# Patient Record
Sex: Female | Born: 1985 | Race: White | Hispanic: No | Smoking: Current every day smoker
Health system: Southern US, Community
[De-identification: ages and names within clinical notes are randomized; demographics above are authoritative.]

## PROBLEM LIST (undated history)

## (undated) DIAGNOSIS — IMO0002 Reserved for concepts with insufficient information to code with codable children: Secondary | ICD-10-CM

## (undated) DIAGNOSIS — R87619 Unspecified abnormal cytological findings in specimens from cervix uteri: Secondary | ICD-10-CM

## (undated) DIAGNOSIS — E669 Obesity, unspecified: Secondary | ICD-10-CM

## (undated) DIAGNOSIS — F32A Depression, unspecified: Secondary | ICD-10-CM

## (undated) DIAGNOSIS — R87629 Unspecified abnormal cytological findings in specimens from vagina: Secondary | ICD-10-CM

## (undated) DIAGNOSIS — N926 Irregular menstruation, unspecified: Secondary | ICD-10-CM

## (undated) DIAGNOSIS — F419 Anxiety disorder, unspecified: Secondary | ICD-10-CM

## (undated) HISTORY — DX: Obesity, unspecified: E66.9

## (undated) HISTORY — DX: Unspecified abnormal cytological findings in specimens from vagina: R87.629

## (undated) HISTORY — DX: Reserved for concepts with insufficient information to code with codable children: IMO0002

## (undated) HISTORY — DX: Unspecified abnormal cytological findings in specimens from cervix uteri: R87.619

## (undated) HISTORY — DX: Irregular menstruation, unspecified: N92.6

---
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
## 2007-04-21 ENCOUNTER — Other Ambulatory Visit: Admission: RE | Admit: 2007-04-21 | Discharge: 2007-04-21 | Payer: Self-pay | Admitting: Obstetrics and Gynecology

## 2007-05-10 ENCOUNTER — Emergency Department (HOSPITAL_COMMUNITY): Admission: EM | Admit: 2007-05-10 | Discharge: 2007-05-10 | Payer: Self-pay | Admitting: Emergency Medicine

## 2007-10-21 ENCOUNTER — Emergency Department (HOSPITAL_COMMUNITY): Admission: EM | Admit: 2007-10-21 | Discharge: 2007-10-21 | Payer: Self-pay | Admitting: Emergency Medicine

## 2009-12-23 ENCOUNTER — Ambulatory Visit (HOSPITAL_COMMUNITY): Admission: RE | Admit: 2009-12-23 | Discharge: 2009-12-23 | Payer: Self-pay | Admitting: Family Medicine

## 2010-02-08 ENCOUNTER — Ambulatory Visit (HOSPITAL_COMMUNITY)
Admission: RE | Admit: 2010-02-08 | Discharge: 2010-02-08 | Payer: Self-pay | Admitting: Physical Medicine and Rehabilitation

## 2011-02-28 LAB — URINE CULTURE: Colony Count: 2000

## 2011-02-28 LAB — URINALYSIS, ROUTINE W REFLEX MICROSCOPIC
Bilirubin Urine: NEGATIVE
Glucose, UA: NEGATIVE
Hgb urine dipstick: NEGATIVE
Ketones, ur: NEGATIVE
Nitrite: NEGATIVE
Protein, ur: NEGATIVE
Specific Gravity, Urine: 1.025
Urobilinogen, UA: 0.2
pH: 7

## 2011-02-28 LAB — URINE MICROSCOPIC-ADD ON

## 2011-03-12 LAB — BASIC METABOLIC PANEL
BUN: 14
Calcium: 9.2
GFR calc non Af Amer: >60
Glucose, Bld: 119 — ABNORMAL HIGH

## 2011-09-17 IMAGING — CR Imaging study
6 series · 6 of 6 positions shown · non-contrast
Comparison: None

CLINICAL DATA: Worsening neck and shoulder pain for 1 year.

CERVICAL SPINE - COMPLETE 4+ VIEW

[view not recorded (1 of 6)]
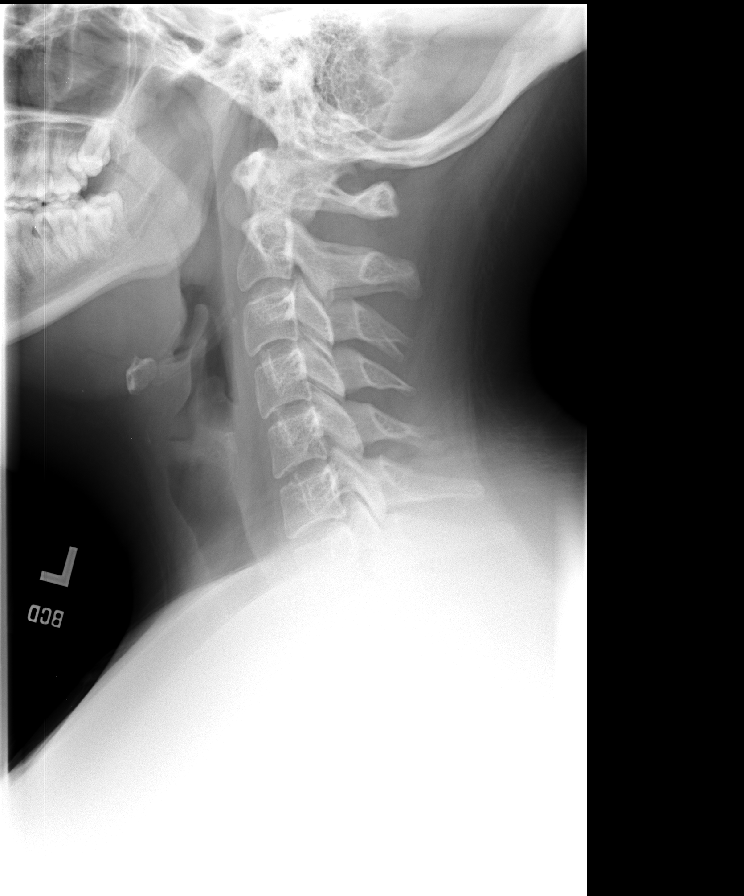

[view not recorded (2 of 6)]
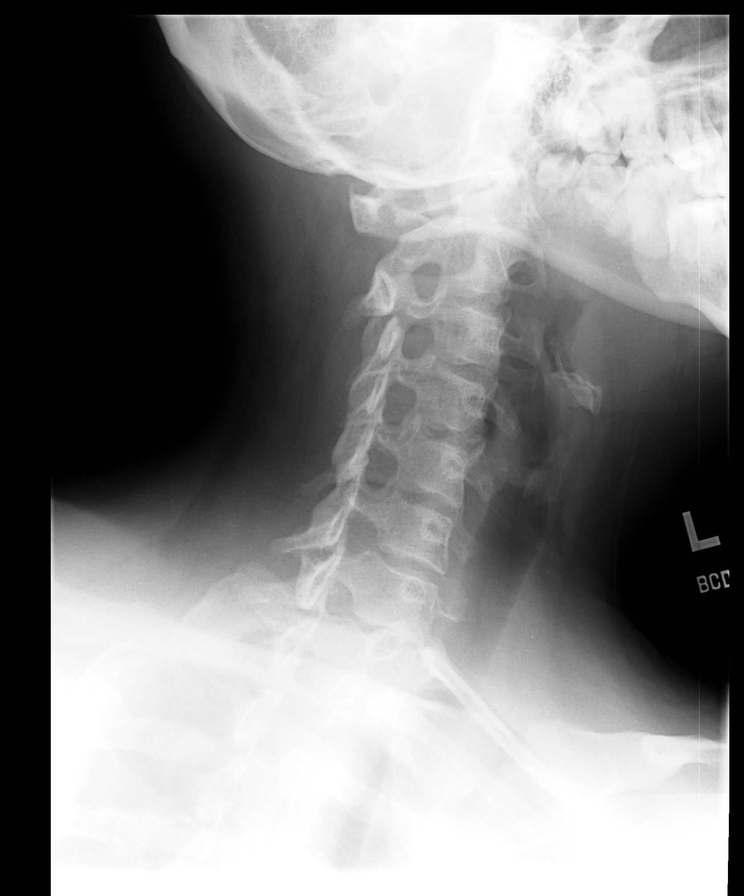

[view not recorded (3 of 6)]
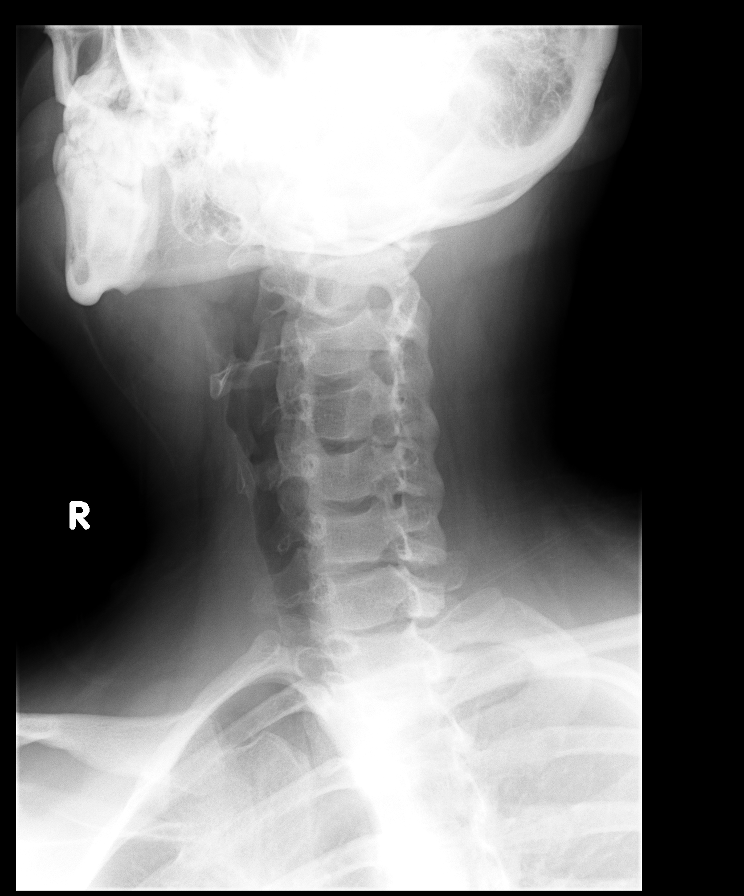

[view not recorded (4 of 6)]
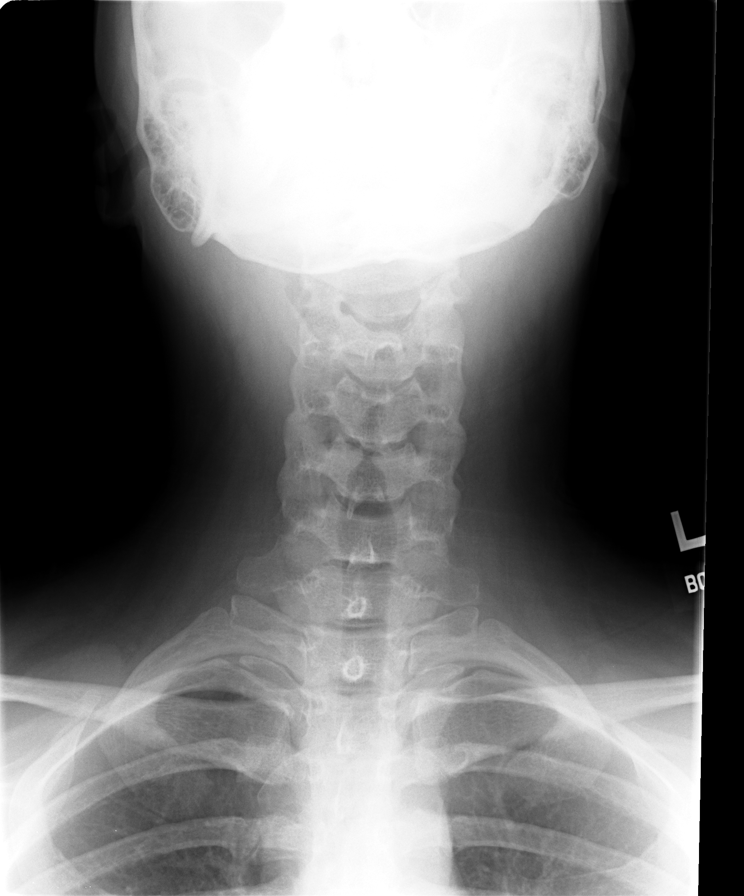

[view not recorded (5 of 6)]
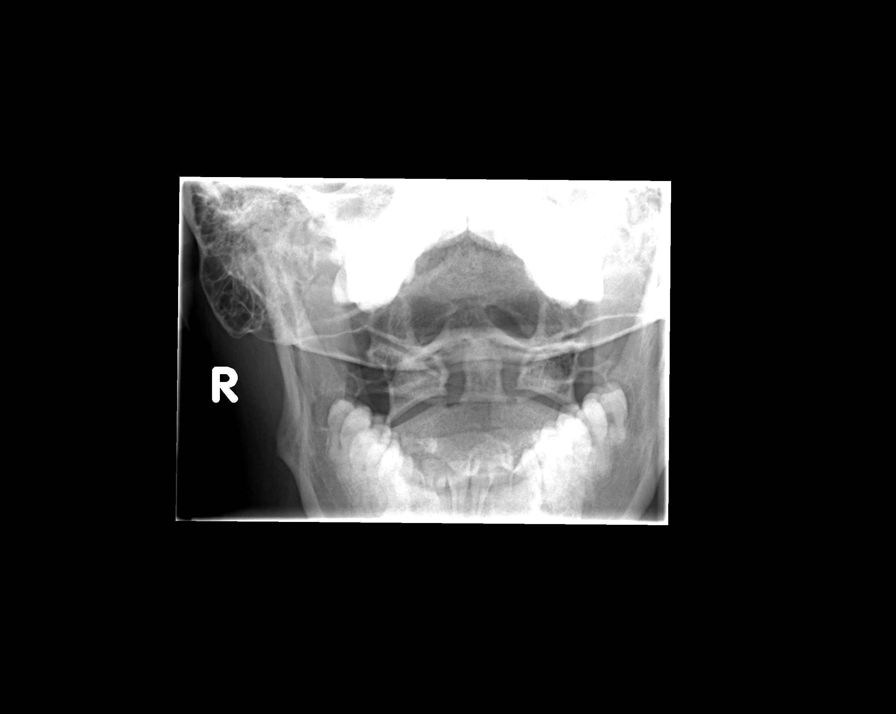

[view not recorded (6 of 6)]
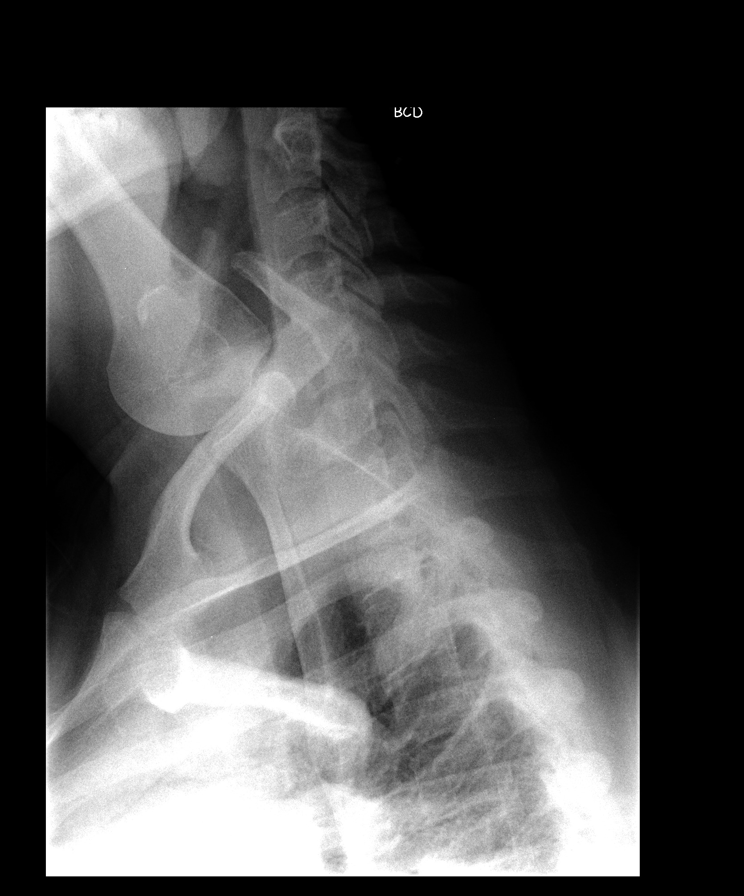

[6 of 6 positions shown; findings below may reference images not displayed]

FINDINGS: There is normal alignment of the cervical spine.  There
is no evidence for acute fracture or dislocation.  Prevertebral
soft tissues have a normal appearance.  Lung apices have a normal
appearance.
IMPRESSION: Negative exam.

## 2012-04-09 ENCOUNTER — Other Ambulatory Visit (HOSPITAL_COMMUNITY)
Admission: RE | Admit: 2012-04-09 | Discharge: 2012-04-09 | Disposition: A | Discharge status: Home / Self Care | Payer: Self-pay | Source: Ambulatory Visit | Attending: Obstetrics and Gynecology | Admitting: Obstetrics and Gynecology

## 2012-04-09 DIAGNOSIS — Z01419 Encounter for gynecological examination (general) (routine) without abnormal findings: Secondary | ICD-10-CM | POA: Insufficient documentation

## 2012-09-18 ENCOUNTER — Telehealth: Payer: Self-pay | Admitting: *Deleted

## 2012-09-18 ENCOUNTER — Telehealth: Payer: Self-pay | Admitting: Adult Health

## 2012-09-18 DIAGNOSIS — Z309 Encounter for contraceptive management, unspecified: Secondary | ICD-10-CM

## 2012-09-18 MED ORDER — NORETHIN ACE-ETH ESTRAD-FE 1-20 MG-MCG PO TABS: 1.0000 | ORAL_TABLET | Freq: Every day | ORAL | Status: DC

## 2012-09-18 MED ORDER — NORETHIN-ETH ESTRAD-FE BIPHAS 1 MG-10 MCG / 10 MCG PO TABS: 1.0000 | ORAL_TABLET | Freq: Every day | ORAL | Status: DC

## 2012-09-18 NOTE — Telephone Encounter (Signed)
Appt made for pt to followup for pap in June. Verbal order for Lo loestrin Fe  Disp. 1 pack take 1 daily with 6 refills per Cyril Mourning, NP. Pt aware.

## 2012-09-18 NOTE — Telephone Encounter (Signed)
Lo loestrin $90 wants cheap, will change to junel 1/20

## 2012-11-05 ENCOUNTER — Encounter: Payer: Self-pay | Admitting: *Deleted

## 2012-11-06 ENCOUNTER — Encounter: Payer: Self-pay | Admitting: *Deleted

## 2012-11-06 ENCOUNTER — Other Ambulatory Visit: Payer: Self-pay | Admitting: Obstetrics & Gynecology

## 2012-11-13 ENCOUNTER — Ambulatory Visit (INDEPENDENT_AMBULATORY_CARE_PROVIDER_SITE_OTHER): Payer: BC Managed Care – PPO | Admitting: Family Medicine

## 2012-11-13 ENCOUNTER — Encounter: Payer: Self-pay | Admitting: Family Medicine

## 2012-11-13 VITALS — BP 110/62 | Temp 98.2°F | Wt 212.0 lb

## 2012-11-13 DIAGNOSIS — R21 Rash and other nonspecific skin eruption: Secondary | ICD-10-CM

## 2012-11-13 MED ORDER — MINOCYCLINE HCL 100 MG PO CAPS: 100.0000 mg | ORAL_CAPSULE | Freq: Two times a day (BID) | ORAL | Status: AC

## 2012-11-13 MED ORDER — CLINDAMYCIN PHOSPHATE 1 % EX SOLN: CUTANEOUS | Status: DC

## 2012-11-13 NOTE — Progress Notes (Signed)
  Subjective:    Patient ID: Jeanne Delgado, female    DOB: 18-Jan-1986, 27 y.o.   MRN: [id number]  HPI  Last wk cut shrubs outside, then rash developed after that eve, didn't know whether due to cosmetics or brush. Rash swollen. Eyes irritated.   Review of Systems Fever no vomiting no headache no cough ROS otherwise negative    Objective:   Physical Exam  Alert no acute distress. Vitals stable. HEENT normal. Lungs clear. Heart regular in rhythm. Skin diffuse acne-like eruption on face upper trunk shoulders      Assessment & Plan:  Impression folliculitis/acne a form rash. Plan Cleocin T. twice a day topically. Minocycline twice a day oral. Symptomatic care discussed.

## 2013-02-16 ENCOUNTER — Other Ambulatory Visit: Payer: Self-pay | Admitting: Family Medicine

## 2014-01-27 ENCOUNTER — Encounter: Payer: Self-pay | Admitting: Family Medicine

## 2014-01-27 ENCOUNTER — Ambulatory Visit (INDEPENDENT_AMBULATORY_CARE_PROVIDER_SITE_OTHER): Payer: Self-pay | Admitting: Family Medicine

## 2014-01-27 VITALS — BP 108/70 | Ht 68.0 in | Wt 238.0 lb

## 2014-01-27 DIAGNOSIS — R197 Diarrhea, unspecified: Secondary | ICD-10-CM

## 2014-01-27 DIAGNOSIS — M545 Low back pain, unspecified: Secondary | ICD-10-CM

## 2014-01-27 DIAGNOSIS — R109 Unspecified abdominal pain: Secondary | ICD-10-CM

## 2014-01-27 LAB — POCT URINALYSIS DIPSTICK
Blood, UA: 250
SPEC GRAV UA: 1.025
Urobilinogen, UA: 2
pH, UA: 7

## 2014-01-27 MED ORDER — ONDANSETRON HCL 8 MG PO TABS: 8.0000 mg | ORAL_TABLET | Freq: Three times a day (TID) | ORAL | Status: DC | PRN

## 2014-01-27 MED ORDER — CIPROFLOXACIN HCL 500 MG PO TABS: 500.0000 mg | ORAL_TABLET | Freq: Two times a day (BID) | ORAL | Status: DC

## 2014-01-27 MED ORDER — TRAMADOL HCL 50 MG PO TABS: 50.0000 mg | ORAL_TABLET | Freq: Four times a day (QID) | ORAL | Status: DC | PRN

## 2014-01-27 NOTE — Progress Notes (Signed)
   Subjective:    Patient ID: Jeanne Delgado, female    DOB: 03/18/1986, 28 y.o.   MRN: [id number]  HPI Patient is here today due to nausea, vomiting & lower back pain & diarrhea  x 2 days.  This patient has had episodes of severe abdominal pain mainly in abdomen lower abdomen along with some loose stools at times needs. She denies blood in her stool denies fever chills night sweats. Does not seem to occur after eating. He just seemed to get her out of the blue this last time it woke her up at 4 AM. The pain was severe. He did not localize. She has had previous trouble at this months ago. He states he uses a fair amount of ibuprofen she denies excessive use. She denies stomach pains or reflux.  Review of Systems  Constitutional: Negative for fever.  HENT: Negative for congestion and ear discharge.   Respiratory: Negative for cough and shortness of breath.   Cardiovascular: Negative for chest pain.  Gastrointestinal: Negative for abdominal pain.       Objective:   Physical Exam  Constitutional: She is oriented to person, place, and time. She appears well-developed. No distress.  HENT:  Head: Normocephalic.  Neck: Neck supple. No thyromegaly present.  Cardiovascular: Normal rate and regular rhythm.   Pulmonary/Chest: Effort normal and breath sounds normal. No respiratory distress. She has no wheezes.  Abdominal: Soft. There is tenderness. There is no rebound and no guarding.  Mid to mid lower  Neurological: She is alert and oriented to person, place, and time.          Assessment & Plan:  Mid to lower abdominal pain and discomfort we will do lab work also do stool tests patient may need referral to gastroenterology for colonoscopy. There is a possibility that there could be inflammatory bowel disease or colitis intermittently occuring

## 2014-01-29 LAB — URINE CULTURE

## 2014-04-08 ENCOUNTER — Other Ambulatory Visit: Payer: Self-pay | Admitting: Obstetrics & Gynecology

## 2014-05-05 ENCOUNTER — Encounter: Payer: Self-pay | Admitting: Nurse Practitioner

## 2014-05-05 ENCOUNTER — Ambulatory Visit (INDEPENDENT_AMBULATORY_CARE_PROVIDER_SITE_OTHER): Payer: BC Managed Care – PPO | Admitting: Nurse Practitioner

## 2014-05-05 VITALS — BP 120/78 | Temp 98.4°F | Ht 68.0 in | Wt 239.4 lb

## 2014-05-05 DIAGNOSIS — J310 Chronic rhinitis: Secondary | ICD-10-CM

## 2014-05-05 DIAGNOSIS — K2970 Gastritis, unspecified, without bleeding: Secondary | ICD-10-CM

## 2014-05-05 DIAGNOSIS — J329 Chronic sinusitis, unspecified: Secondary | ICD-10-CM

## 2014-05-05 DIAGNOSIS — K2990 Gastroduodenitis, unspecified, without bleeding: Secondary | ICD-10-CM

## 2014-05-05 DIAGNOSIS — J209 Acute bronchitis, unspecified: Secondary | ICD-10-CM

## 2014-05-05 DIAGNOSIS — K589 Irritable bowel syndrome without diarrhea: Secondary | ICD-10-CM

## 2014-05-05 MED ORDER — OMEPRAZOLE 20 MG PO CPDR: 20.0000 mg | DELAYED_RELEASE_CAPSULE | Freq: Two times a day (BID) | ORAL | Status: DC

## 2014-05-05 MED ORDER — HYDROCODONE-HOMATROPINE 5-1.5 MG/5ML PO SYRP: 5.0000 mL | ORAL_SOLUTION | ORAL | Status: DC | PRN

## 2014-05-05 MED ORDER — AZITHROMYCIN 250 MG PO TABS: ORAL_TABLET | ORAL | Status: DC

## 2014-05-05 MED ORDER — ALBUTEROL SULFATE HFA 108 (90 BASE) MCG/ACT IN AERS: 2.0000 | INHALATION_SPRAY | RESPIRATORY_TRACT | Status: DC | PRN

## 2014-05-05 NOTE — Patient Instructions (Signed)
activia yogurt 2 cups per day OR Insurance claims handlerAlign   Food Choices for Gastroesophageal Reflux Disease When you have gastroesophageal reflux disease (GERD), the foods you eat and your eating habits are very important. Choosing the right foods can help ease the discomfort of GERD. WHAT GENERAL GUIDELINES DO I NEED TO FOLLOW?  Choose fruits, vegetables, whole grains, low-fat dairy products, and low-fat meat, fish, and poultry.  Limit fats such as oils, salad dressings, butter, nuts, and avocado.  Keep a food diary to identify foods that cause symptoms.  Avoid foods that cause reflux. These may be different for different people.  Eat frequent small meals instead of three large meals each day.  Eat your meals slowly, in a relaxed setting.  Limit fried foods.  Cook foods using methods other than frying.  Avoid drinking alcohol.  Avoid drinking large amounts of liquids with your meals.  Avoid bending over or lying down until 2-3 hours after eating. WHAT FOODS ARE NOT RECOMMENDED? The following are some foods and drinks that may worsen your symptoms: Vegetables Tomatoes. Tomato juice. Tomato and spaghetti sauce. Chili peppers. Onion and garlic. Horseradish. Fruits Oranges, grapefruit, and lemon (fruit and juice). Meats High-fat meats, fish, and poultry. This includes hot dogs, ribs, ham, sausage, salami, and bacon. Dairy Whole milk and chocolate milk. Sour cream. Cream. Butter. Ice cream. Cream cheese.  Beverages Coffee and tea, with or without caffeine. Carbonated beverages or energy drinks. Condiments Hot sauce. Barbecue sauce.  Sweets/Desserts Chocolate and cocoa. Donuts. Peppermint and spearmint. Fats and Oils High-fat foods, including [place] fries and potato chips. Other Vinegar. Strong spices, such as black pepper, white pepper, red pepper, cayenne, curry powder, cloves, ginger, and chili powder. The items listed above may not be a complete list of foods and beverages to avoid.  Contact your dietitian for more information. Document Released: 05/21/2005 Document Revised: 05/26/2013 Document Reviewed: 03/25/2013 [facility]ExitCare Patient Information 2015 [place], [place]. This information is not intended to replace advice given to you by your health care provider. Make sure you discuss any questions you have with your health care provider. Irritable Bowel Syndrome Irritable bowel syndrome (IBS) is caused by a disturbance of normal bowel function and is a common digestive disorder. You may also hear this condition called spastic colon, mucous colitis, and irritable colon. There is no cure for IBS. However, symptoms often gradually improve or disappear with a good diet, stress management, and medicine. This condition usually appears in late adolescence or early adulthood. Women develop it twice as often as men. CAUSES  After food has been digested and absorbed in the small intestine, waste material is moved into the large intestine, or colon. In the colon, water and salts are absorbed from the undigested products coming from the small intestine. The remaining residue, or fecal material, is held for elimination. Under normal circumstances, gentle, rhythmic contractions of the bowel walls push the fecal material along the colon toward the rectum. In IBS, however, these contractions are irregular and poorly coordinated. The fecal material is either retained too long, resulting in constipation, or expelled too soon, producing diarrhea. SIGNS AND SYMPTOMS  The most common symptom of IBS is abdominal pain. It is often in the lower left side of the abdomen, but it may occur anywhere in the abdomen. The pain comes from spasms of the bowel muscles happening too much and from the buildup of gas and fecal material in the colon. This pain:  Can range from sharp abdominal cramps to a dull, continuous ache.  Often worsens soon after eating.  Is often relieved by having a bowel movement or passing  gas. Abdominal pain is usually accompanied by constipation, but it may also produce diarrhea. The diarrhea often occurs right after a meal or upon waking up in the morning. The stools are often soft, watery, and flecked with mucus. Other symptoms of IBS include:  Bloating.  Loss of appetite.  Heartburn.  Backache.  Dull pain in the arms or shoulders.  Nausea.  Burping.  Vomiting.  Gas. IBS may also cause symptoms that are unrelated to the digestive system, such as:  Fatigue.  Headaches.  Anxiety.  Shortness of breath.  Trouble concentrating.  Dizziness. These symptoms tend to come and go. DIAGNOSIS  The symptoms of IBS may seem like symptoms of other, more serious digestive disorders. Your health care provider may want to perform tests to exclude these disorders.  TREATMENT Many medicines are available to help correct bowel function or relieve bowel spasms and abdominal pain. Among the medicines available are:  Laxatives for severe constipation and to help restore normal bowel habits.  Specific antidiarrheal medicines to treat severe or lasting diarrhea.  Antispasmodic agents to relieve intestinal cramps. Your health care provider may also decide to treat you with a mild tranquilizer or sedative during unusually stressful periods in your life. Your health care provider may also prescribe antidepressant medicine. The use of this medicine has been shown to reduce pain and other symptoms of IBS. Remember that if any medicine is prescribed for you, you should take it exactly as directed. Make sure your health care provider knows how well it worked for you. HOME CARE INSTRUCTIONS   Take all medicines as directed by your health care provider.  Avoid foods that are high in fat or oils, such as heavy cream, butter, frankfurters, sausage, and other fatty meats.  Avoid foods that make you go to the bathroom, such as fruit, fruit juice, and dairy products.  Cut out  carbonated drinks, chewing gum, and "gassy" foods such as beans and cabbage. This may help relieve bloating and burping.  Eat foods with bran, and drink plenty of liquids with the bran foods. This helps relieve constipation.  Keep track of what foods seem to bring on your symptoms.  Avoid emotionally charged situations or circumstances that produce anxiety.  Start or continue exercising.  Get plenty of rest and sleep. Document Released: 05/21/2005 Document Revised: 05/26/2013 Document Reviewed: 01/09/2008 [facility] Patient Information 2015 [place]Care, [place]. This information is not intended to replace advice given to you by your health care provider. Make sure you discuss any questions you have with your health care provider.

## 2014-05-05 NOTE — Progress Notes (Signed)
Subjective:  Presents for c/o cough x 4 d. Worsening. Usually nonproductive but green sputum noted this am. Wheezing began 2 d ago. Watery eyes. Ear pain. Sore throat. No headache. Having acid reflux. Has been taking Prilosec x 4 d with slight improvement. Sharp epigastric pain at times especially with eating and certain foods. Drinks large amount of caffeine. Worse with greasy foods, chocolate and eating late at night. Also, having diarrhea usually in the mornings especially if she eats before bed. No blood in her stools. Having moderate stress. No alcohol or tobacco use.   Objective:   BP 120/78 mmHg  Temp(Src) 98.4 F (36.9 C) (Oral)  Ht 5\' 8"  (1.727 m)  Wt 239 lb 6.4 oz (108.591 kg)  BMI 36.41 kg/m2 NAD. Alert, oriented. TMs retracted, clear effusion. Pharynx no erythema or exudate; tonsils 2+ bilat. PND noted. Neck supple with mild anterior adenopathy. Lungs scattered expiratory crackles, no wheezing or tachypnea. Clears throat frequently. Heart RRR. Abdomen: distinct epigastric tenderness to palpation.   Assessment: Rhinosinusitis  Acute bronchitis, unspecified organism  Irritable bowel syndrome  Gastritis and gastroduodenitis  Plan:  Meds ordered this encounter  Medications  . azithromycin (ZITHROMAX Z-PAK) 250 MG tablet    Sig: Take 2 tablets (500 mg) on  Day 1,  followed by 1 tablet (250 mg) once daily on Days 2 through 5.    Dispense:  6 each    Refill:  0    Order Specific Question:  Supervising Provider    Answer:  Merlyn AlbertLUKING, WILLIAM S [[id number]]  . HYDROcodone-homatropine (HYCODAN) 5-1.5 MG/5ML syrup    Sig: Take 5 mLs by mouth every 4 (four) hours as needed.    Dispense:  120 mL    Refill:  0    Order Specific Question:  Supervising Provider    Answer:  Merlyn AlbertLUKING, WILLIAM S [[id number]]  . albuterol (PROVENTIL HFA;VENTOLIN HFA) 108 (90 BASE) MCG/ACT inhaler    Sig: Inhale 2 puffs into the lungs every 4 (four) hours as needed for wheezing or shortness of breath.    Dispense:  1  Inhaler    Refill:  0    Order Specific Question:  Supervising Provider    Answer:  Merlyn AlbertLUKING, WILLIAM S [[id number]]  . omeprazole (PRILOSEC) 20 MG capsule    Sig: Take 1 capsule (20 mg total) by mouth 2 (two) times daily before a meal.    Dispense:  60 capsule    Refill:  2    Order Specific Question:  Supervising Provider    Answer:  Merlyn AlbertLUKING, WILLIAM S [[id number]]   OTC meds as directed for daytime cough. Given written and verbal info on GERD and IBS. Call back in 2 weeks if not significantly improved. Slowly wean off all caffeine. Add bowel probiotic.

## 2014-05-17 ENCOUNTER — Telehealth: Payer: Self-pay | Admitting: Family Medicine

## 2014-05-17 NOTE — Telephone Encounter (Signed)
Fever? Still having acid reflux? Where is abd pain? N/V? Still having IBS? Blood in stool? Taking Omeprazole BID? Did it help?

## 2014-05-17 NOTE — Telephone Encounter (Signed)
Office visit recommended. If symptoms are severe, go to ED.

## 2014-05-17 NOTE — Telephone Encounter (Signed)
Number to reach her this am :  [phone]

## 2014-05-17 NOTE — Telephone Encounter (Signed)
Transferred patient to front desk to schedule appointment.  

## 2014-05-17 NOTE — Telephone Encounter (Signed)
Patient is still having stomach pain.  She was to call back today if no better.

## 2014-05-17 NOTE — Telephone Encounter (Signed)
Patient said she is still having sharp stabbing pains in stomach

## 2014-05-20 ENCOUNTER — Ambulatory Visit: Payer: BC Managed Care – PPO | Admitting: Nurse Practitioner

## 2014-07-12 ENCOUNTER — Encounter: Payer: Self-pay | Admitting: Nurse Practitioner

## 2014-07-12 ENCOUNTER — Ambulatory Visit (INDEPENDENT_AMBULATORY_CARE_PROVIDER_SITE_OTHER): Payer: BLUE CROSS/BLUE SHIELD | Admitting: Nurse Practitioner

## 2014-07-12 VITALS — BP 130/70 | Temp 97.7°F | Ht 68.0 in | Wt 239.1 lb

## 2014-07-12 DIAGNOSIS — J029 Acute pharyngitis, unspecified: Secondary | ICD-10-CM

## 2014-07-12 DIAGNOSIS — K2100 Gastro-esophageal reflux disease with esophagitis, without bleeding: Secondary | ICD-10-CM

## 2014-07-12 DIAGNOSIS — B349 Viral infection, unspecified: Secondary | ICD-10-CM

## 2014-07-12 LAB — POCT RAPID STREP A (OFFICE): RAPID STREP A SCREEN: NEGATIVE

## 2014-07-12 MED ORDER — PANTOPRAZOLE SODIUM 40 MG PO TBEC: 40.0000 mg | DELAYED_RELEASE_TABLET | Freq: Two times a day (BID) | ORAL | Status: DC

## 2014-07-12 MED ORDER — PHENTERMINE HCL 37.5 MG PO TABS: 37.5000 mg | ORAL_TABLET | Freq: Every day | ORAL | Status: DC

## 2014-07-13 ENCOUNTER — Encounter: Payer: Self-pay | Admitting: Nurse Practitioner

## 2014-07-13 DIAGNOSIS — K2100 Gastro-esophageal reflux disease with esophagitis, without bleeding: Secondary | ICD-10-CM | POA: Insufficient documentation

## 2014-07-13 LAB — STREP A DNA PROBE: GASP: NEGATIVE

## 2014-07-13 NOTE — Progress Notes (Signed)
Subjective:  Presents for complaints of sore throat low-grade fever headache with occasional cough that began this morning. Some vomiting with a flareup of her acid reflux. Some watery diarrhea. No abdominal pain. Some ear pressure. No wheezing. Taking some clear fluids. Voiding normal limit. Saw no relief of her reflux with omeprazole 20 mg twice a day. Started taking 2 in the morning and 1 at night which greatly helped her symptoms. Has started align which has helped her IBS. Wishes to restart phentermine for her weight loss, has been off of this for over 6 months. Has taken in the past without difficulty.  Objective:   BP 130/70 mmHg  Temp(Src) 97.7 F (36.5 C) (Oral)  Ht 5\' 8"  (1.727 m)  Wt 239 lb 2 oz (108.466 kg)  BMI 36.37 kg/m2 NAD. Alert, oriented. TMs retracted, no erythema. Pharynx moderate posterior pharynx erythema, no exudate or lesions. RST negative. Neck supple with mild soft anterior adenopathy. Lungs clear. Heart regular rate rhythm. Abdomen soft nondistended with minimal epigastric area tenderness, mild lower abdominal tenderness. No rebound or guarding. No obvious masses.  Assessment: Acute pharyngitis, unspecified pharyngitis type - Plan: POCT rapid strep A, Strep A DNA probe  Gastroesophageal reflux disease with esophagitis  Morbid obesity  Probable Viral illness  Plan:  Meds ordered this encounter  Medications  . pantoprazole (PROTONIX) 40 MG tablet    Sig: Take 1 tablet (40 mg total) by mouth 2 (two) times daily before a meal.    Dispense:  60 tablet    Refill:  2    Order Specific Question:  Supervising Provider    Answer:  Merlyn AlbertLUKING, WILLIAM S [[id number]]  . phentermine (ADIPEX-P) 37.5 MG tablet    Sig: Take 1 tablet (37.5 mg total) by mouth daily before breakfast.    Dispense:  30 tablet    Refill:  2    Order Specific Question:  Supervising Provider    Answer:  Merlyn AlbertLUKING, WILLIAM S [[id number]]   Switch to Protonix twice a day, reduce to daily dose once reflux has  resolved. Callback in 2-3 weeks if no improvement. Encouraged healthy diet and regular activity. Call back by the end of the week if no improvement in current symptoms. Return in about 3 months (around 10/10/2014) for recheck on GI problems.

## 2014-10-04 ENCOUNTER — Ambulatory Visit: Payer: BLUE CROSS/BLUE SHIELD | Admitting: Nurse Practitioner

## 2014-10-04 DIAGNOSIS — Z029 Encounter for administrative examinations, unspecified: Secondary | ICD-10-CM

## 2016-04-03 ENCOUNTER — Ambulatory Visit: Payer: Self-pay | Admitting: Family Medicine

## 2016-04-03 DIAGNOSIS — Z0289 Encounter for other administrative examinations: Secondary | ICD-10-CM

## 2016-05-14 ENCOUNTER — Telehealth: Payer: Self-pay | Admitting: Adult Health

## 2016-05-14 NOTE — Telephone Encounter (Signed)
Spoke with pt. Pt has an appt 12/20 for pap. Her period ended 2 weeks ago. Today, she started bleeding and passing clots. She is having nausea. Pt has had 1 positive UPT and 1 negative UPT. Pt is not bleeding now. I spoke with JAG and she advised to do a Barrister's clerkquant tomorrow. I left pt a message letting her know to have quant checked tomorrow am. [employer] EnergyJSY

## 2016-05-15 ENCOUNTER — Other Ambulatory Visit: Payer: 59

## 2016-05-15 ENCOUNTER — Encounter: Payer: Self-pay | Admitting: Adult Health

## 2016-05-15 ENCOUNTER — Ambulatory Visit (INDEPENDENT_AMBULATORY_CARE_PROVIDER_SITE_OTHER): Payer: 59 | Admitting: Adult Health

## 2016-05-15 ENCOUNTER — Encounter (INDEPENDENT_AMBULATORY_CARE_PROVIDER_SITE_OTHER): Payer: Self-pay

## 2016-05-15 VITALS — BP 100/60 | HR 96 | Ht 67.0 in | Wt 242.4 lb

## 2016-05-15 DIAGNOSIS — Z3200 Encounter for pregnancy test, result unknown: Secondary | ICD-10-CM

## 2016-05-15 DIAGNOSIS — Z3202 Encounter for pregnancy test, result negative: Secondary | ICD-10-CM

## 2016-05-15 DIAGNOSIS — N921 Excessive and frequent menstruation with irregular cycle: Secondary | ICD-10-CM | POA: Diagnosis not present

## 2016-05-15 LAB — BETA HCG QUANT (REF LAB)

## 2016-05-15 LAB — POCT URINE PREGNANCY: PREG TEST UR: NEGATIVE

## 2016-05-15 MED ORDER — MEGESTROL ACETATE 40 MG PO TABS: ORAL_TABLET | ORAL | 0 refills | Status: DC

## 2016-05-15 NOTE — Progress Notes (Signed)
Subjective:     Patient ID: Jeanne Delgado, female   DOB: [birth year], 30 y.o.   MRN: [id number]  HPI Jeanne Delgado is a 30 year old white female, married complaining of irregular periods and heavy bleeding with clots and cramps for 2 days.Had +HPT end of November then had period 12/1 with negative HPT.Started bleeding yesterday with clots and cramps and was heavy changing every 1-2 hours, better now.and she would like to be pregnant.  Review of Systems Heavy bleeding with clots and cramps Irregular periods Reviewed past medical,surgical, social and family history. Reviewed medications and allergies.     Objective:   Physical Exam BP 100/60   Pulse 96   Ht 5\' 7"  (1.702 m)   Wt 242 lb 6.4 oz (110 kg)   LMP 05/04/2016   BMI 37.97 kg/m UPT negative    QHCG was <1 this am Skin warm and dry.Pelvic: external genitalia is normal in appearance no lesions, vagina:period like blood,urethra has no lesions or masses noted, cervix:smooth, uterus: normal size, shape and contour, non tender, no masses felt, adnexa: no masses or tenderness noted. Bladder is non tender and no masses felt.  PHQ 9 score 4,denies any suicidal ideations.  Will get US to assess uterus and ovaries and give megace to stop bleeding and get back in for pap and physical in about 2 weeks.  Assessment:     1. Menorrhagia with irregular cycle   2. Possible pregnancy       Plan:     Return in 2 days for gyn US And then in 2 weeks for pap and physical  Rx megace 40 mg #45 3 x 5 days then 2 x 5 days then 1 daily

## 2016-05-15 NOTE — Addendum Note (Signed)
Addended by: Criss AlvinePULLIAM, Phuoc Huy G on: 05/15/2016 08:44 AM   Modules accepted: Orders

## 2016-05-17 ENCOUNTER — Ambulatory Visit (INDEPENDENT_AMBULATORY_CARE_PROVIDER_SITE_OTHER): Payer: 59

## 2016-05-17 DIAGNOSIS — N921 Excessive and frequent menstruation with irregular cycle: Secondary | ICD-10-CM | POA: Diagnosis not present

## 2016-05-17 DIAGNOSIS — N854 Malposition of uterus: Secondary | ICD-10-CM | POA: Diagnosis not present

## 2016-05-17 NOTE — Progress Notes (Signed)
PELVIC US TA/TV: homogeneous anteverted uterus,wnl,EEC 5.497mm,normal ov's bilat,small amount of simple cul de sac fluid,no pain during ultrasound,ovaries appears mobile

## 2016-05-21 ENCOUNTER — Telehealth: Payer: Self-pay | Admitting: Adult Health

## 2016-05-21 NOTE — Telephone Encounter (Signed)
Left message that US normal and hope bleeding has stopped and see ya soon for pap and physical

## 2016-05-22 ENCOUNTER — Encounter: Payer: Self-pay | Admitting: Family Medicine

## 2016-05-23 ENCOUNTER — Other Ambulatory Visit: Payer: Self-pay | Admitting: Adult Health

## 2016-05-30 ENCOUNTER — Ambulatory Visit (INDEPENDENT_AMBULATORY_CARE_PROVIDER_SITE_OTHER): Payer: 59 | Admitting: Adult Health

## 2016-05-30 ENCOUNTER — Encounter: Payer: Self-pay | Admitting: Adult Health

## 2016-05-30 ENCOUNTER — Other Ambulatory Visit (HOSPITAL_COMMUNITY)
Admission: RE | Admit: 2016-05-30 | Discharge: 2016-05-30 | Disposition: A | Discharge status: Home / Self Care | Payer: 59 | Source: Ambulatory Visit | Attending: Adult Health | Admitting: Adult Health

## 2016-05-30 VITALS — BP 100/61 | HR 86 | Ht 67.0 in | Wt 246.5 lb

## 2016-05-30 DIAGNOSIS — Z1151 Encounter for screening for human papillomavirus (HPV): Secondary | ICD-10-CM | POA: Diagnosis not present

## 2016-05-30 DIAGNOSIS — Z01419 Encounter for gynecological examination (general) (routine) without abnormal findings: Secondary | ICD-10-CM | POA: Insufficient documentation

## 2016-05-30 NOTE — Patient Instructions (Signed)
Keep period log Physical in 1 year

## 2016-05-30 NOTE — Progress Notes (Signed)
Patient ID: Jeanne Delgado, female   DOB: 1986/03/05, 30 y.o.   MRN: [id number] History of Present Illness: Jeanne Delgado is a 30 year old white female married in for well woman gyn exam and pap.    Current Medications, Allergies, Past Medical History, Past Surgical History, Family History and Social History [facility].     Review of Systems: Patient denies any headaches, hearing loss, fatigue, blurred vision, shortness of breath, chest pain, abdominal pain, problems with bowel movements, urination, or intercourse. No joint pain or mood swings.    Physical Exam:BP 100/61 (BP Location: Left Arm, Patient Position: Sitting, Cuff Size: Large)   Pulse 86   Ht 5\' 7"  (1.702 m)   Wt 246 lb 8 oz (111.8 kg)   LMP 05/04/2016 (Exact Date)   BMI 38.61 kg/m  General:  Well developed, well nourished, no acute distress Skin:  Warm and dry Neck:  Midline trachea, normal thyroid, good ROM, no lymphadenopathy Lungs; Clear to auscultation bilaterally Breast:  No dominant palpable mass, retraction, or nipple discharge Cardiovascular: Regular rate and rhythm Abdomen:  Soft, non tender, no hepatosplenomegaly Pelvic:  External genitalia is normal in appearance, no lesions.  The vagina is normal in appearance. Urethra has no lesions or masses. The cervix is smooth.  Uterus is felt to be normal size, shape, and contour.  No adnexal masses or tenderness noted.Bladder is non tender, no masses felt Extremities/musculoskeletal:  No swelling or varicosities noted, no clubbing or cyanosis Psych:  No mood changes, alert and cooperative,seems happy PHQ 2 score 1.Would like to be pregnant, maybe next year.   Impression: 1. Encounter for gynecological examination with Papanicolaou smear of cervix       Plan: Take prenatal vitamin with folic acid Can use luvena for vaginal moisture and astro glide with sex Keep period log Physical in 1 year Pa in 3 if normal

## 2016-06-01 ENCOUNTER — Encounter: Payer: Self-pay | Admitting: Adult Health

## 2016-06-01 LAB — CYTOLOGY - PAP
Adequacy: ABSENT
Diagnosis: NEGATIVE
HPV (WINDOPATH): NOT DETECTED

## 2016-06-05 DIAGNOSIS — Z029 Encounter for administrative examinations, unspecified: Secondary | ICD-10-CM

## 2017-02-12 ENCOUNTER — Emergency Department (HOSPITAL_COMMUNITY)
Admission: EM | Admit: 2017-02-12 | Discharge: 2017-02-12 | Disposition: A | Discharge status: Home / Self Care | Payer: 59 | Attending: Emergency Medicine | Admitting: Emergency Medicine

## 2017-02-12 ENCOUNTER — Encounter (HOSPITAL_COMMUNITY): Payer: Self-pay | Admitting: Emergency Medicine

## 2017-02-12 DIAGNOSIS — N898 Other specified noninflammatory disorders of vagina: Secondary | ICD-10-CM

## 2017-02-12 LAB — URINALYSIS, ROUTINE W REFLEX MICROSCOPIC
BACTERIA UA: NONE SEEN
BILIRUBIN URINE: NEGATIVE
Glucose, UA: NEGATIVE mg/dL
Ketones, ur: NEGATIVE mg/dL
LEUKOCYTES UA: NEGATIVE
NITRITE: NEGATIVE
PROTEIN: NEGATIVE mg/dL
RBC / HPF: NONE SEEN RBC/hpf (ref 0–5)
SPECIFIC GRAVITY, URINE: 1.016 (ref 1.005–1.030)
pH: 7 (ref 5.0–8.0)

## 2017-02-12 LAB — PREGNANCY, URINE: Preg Test, Ur: NEGATIVE

## 2017-02-12 LAB — WET PREP, GENITAL
Clue Cells Wet Prep HPF POC: NONE SEEN
SPERM: NONE SEEN
Trich, Wet Prep: NONE SEEN
YEAST WET PREP: NONE SEEN

## 2017-02-12 MED ORDER — LIDOCAINE HCL (PF) 1 % IJ SOLN
INTRAMUSCULAR | Status: AC
  Filled 2017-02-12: qty 5

## 2017-02-12 MED ORDER — CEFTRIAXONE SODIUM 250 MG IJ SOLR
250.0000 mg | INTRAMUSCULAR | Status: DC
  Administered 2017-02-12: 250 mg via INTRAMUSCULAR
  Filled 2017-02-12: qty 250

## 2017-02-12 MED ORDER — AZITHROMYCIN 250 MG PO TABS
1000.0000 mg | ORAL_TABLET | Freq: Once | ORAL | Status: AC
  Administered 2017-02-12: 1000 mg via ORAL
  Filled 2017-02-12: qty 4

## 2017-02-12 NOTE — ED Triage Notes (Signed)
Patient c/o right flank pain that radiates into right lower abd. Patient states dysuria, frequency with oliguria, and some discharge. Patient reports fever last week but none this week. Patient states she is recently separated and with new partner. Denies any nausea, vomiting, or diarrhea.

## 2017-02-12 NOTE — ED Provider Notes (Signed)
[facility]-EMERGENCY DEPT Provider Note   CSN: [id number] Arrival date & time: 02/12/17  1009     History   Chief Complaint Chief Complaint  Patient presents with  . Flank Pain    HPI Jeanne Delgado is a 31 y.o. female.Chief complaint is vaginal discharge.  HPI:  31 year old female. Recently separated. She states he has concerns about her previous relationship and some recent vaginal discharge. She states she had a sore throat for 5 days ago and saw some white in her throat and took amoxicillin for 4 days. No fever now. No sore throat. She denies oral intercourse or concern about oral ST eyes. Has had some vaginal discharge. It is 10. Some back and pelvic pain although minimal. No bleeding. Last menstrual period was 2 weeks ago and on time for her. No history of STI.  Past Medical History:  Diagnosis Date  . Abnormal Pap smear   . Irregular menses   . Obesity   . Vaginal Pap smear, abnormal     Patient Active Problem List   Diagnosis Date Noted  . Encounter for gynecological examination with Papanicolaou smear of cervix 05/30/2016  . Morbid obesity (HCC) 07/13/2014  . Gastroesophageal reflux disease with esophagitis 07/13/2014  . Irritable bowel syndrome 05/05/2014    History reviewed. No pertinent surgical history.  OB History    Gravida Para Term Preterm AB Living   1       1     SAB TAB Ectopic Multiple Live Births   1               Home Medications    Prior to Admission medications   Not on File    Family History Family History  Problem Relation Age of Onset  . Cancer Other   . Diabetes Maternal Grandmother   . Liver disease Father     Social History Social History  Substance Use Topics  . Smoking status: Never Smoker  . Smokeless tobacco: Never Used  . Alcohol use Yes     Comment: rarely     Allergies   Dimetapp decongestant [pseudoephedrine]   Review of Systems Review of Systems  Constitutional: Negative for appetite change, chills,  diaphoresis, fatigue and fever.  HENT: Negative for mouth sores, sore throat and trouble swallowing.   Eyes: Negative for visual disturbance.  Respiratory: Negative for cough, chest tightness, shortness of breath and wheezing.   Cardiovascular: Negative for chest pain.  Gastrointestinal: Negative for abdominal distention, abdominal pain, diarrhea, nausea and vomiting.  Endocrine: Negative for polydipsia, polyphagia and polyuria.  Genitourinary: Positive for vaginal discharge. Negative for dysuria, frequency and hematuria.  Musculoskeletal: Negative for gait problem.  Skin: Negative for color change, pallor and rash.  Neurological: Negative for dizziness, syncope, light-headedness and headaches.  Hematological: Does not bruise/bleed easily.  Psychiatric/Behavioral: Negative for behavioral problems and confusion.     Physical Exam Updated Vital Signs BP 121/81   Pulse 92   Temp 98.4 F (36.9 C) (Oral)   Resp 18   Ht 5\' 7"  (1.702 m)   Wt 104.3 kg (230 lb)   LMP 01/22/2017   SpO2 100%   BMI 36.02 kg/m   Physical Exam  Constitutional: She is oriented to person, place, and time. She appears well-developed and well-nourished. No distress.  HENT:  Head: Normocephalic.  Eyes: Pupils are equal, round, and reactive to light. Conjunctivae are normal. No scleral icterus.  Neck: Normal range of motion. Neck supple. No thyromegaly present.  Cardiovascular: Normal  rate and regular rhythm.  Exam reveals no gallop and no friction rub.   No murmur heard. Pulmonary/Chest: Effort normal and breath sounds normal. No respiratory distress. She has no wheezes. She has no rales.  Abdominal: Soft. Bowel sounds are normal. She exhibits no distension. There is no tenderness. There is no rebound.  Genitourinary:  Genitourinary Comments: Cervix is friable. Bleeds with speculum. Some brown discharge. Swab for GC and chlamydia. She has no cervical motion tenderness and no adnexal tenderness or fullness.    Musculoskeletal: Normal range of motion.  Neurological: She is alert and oriented to person, place, and time.  Skin: Skin is warm and dry. No rash noted.  Psychiatric: She has a normal mood and affect. Her behavior is normal.     ED Treatments / Results  Labs (all labs ordered are listed, but only abnormal results are displayed) Labs Reviewed  WET PREP, GENITAL - Abnormal; Notable for the following:       Result Value   WBC, Wet Prep HPF POC MODERATE (*)    All other components within normal limits  URINALYSIS, ROUTINE W REFLEX MICROSCOPIC - Abnormal; Notable for the following:    Hgb urine dipstick SMALL (*)    Squamous Epithelial / LPF 6-30 (*)    All other components within normal limits  PREGNANCY, URINE  GC/CHLAMYDIA PROBE AMP ([place]) NOT AT [facility]    EKG  EKG Interpretation None       Radiology No results found.  Procedures Procedures (including critical care time)  Medications Ordered in ED Medications  cefTRIAXone (ROCEPHIN) injection 250 mg (not administered)  azithromycin (ZITHROMAX) tablet 1,000 mg (not administered)     Initial Impression / Assessment and Plan / ED Course  I have reviewed the triage vital signs and the nursing notes.  Pertinent labs & imaging results that were available during my care of the patient were reviewed by me and considered in my medical decision making (see chart for details).    Nuchal signs of cervicitis. No signs of PID. I offered her pain. Treatment will awaiting testing versus testing and primary care follow-up. She has strong preference to undergo empiric treatment here. Given Rocephin IM, Zithromax IM. Discharge home. Primary care follow-up. Beta patency, negative wet prep.  Final Clinical Impressions(s) / ED Diagnoses   Final diagnoses:  Vaginal discharge    New Prescriptions New Prescriptions   No medications on file     Rolland Porter, MD 02/12/17 1451

## 2017-02-12 NOTE — Discharge Instructions (Signed)
You can look at "my chart" in 3-4 days to check your results.

## 2017-02-15 LAB — GC/CHLAMYDIA PROBE AMP (~~LOC~~) NOT AT ARMC
Chlamydia: NEGATIVE
Neisseria Gonorrhea: NEGATIVE

## 2017-12-07 ENCOUNTER — Encounter (HOSPITAL_COMMUNITY): Payer: Self-pay

## 2017-12-07 ENCOUNTER — Other Ambulatory Visit: Payer: Self-pay

## 2017-12-07 ENCOUNTER — Emergency Department (HOSPITAL_COMMUNITY)
Admission: EM | Admit: 2017-12-07 | Discharge: 2017-12-07 | Disposition: A | Discharge status: Home / Self Care | Payer: 59 | Attending: Emergency Medicine | Admitting: Emergency Medicine

## 2017-12-07 DIAGNOSIS — F17210 Nicotine dependence, cigarettes, uncomplicated: Secondary | ICD-10-CM | POA: Diagnosis not present

## 2017-12-07 DIAGNOSIS — J029 Acute pharyngitis, unspecified: Secondary | ICD-10-CM | POA: Diagnosis not present

## 2017-12-07 DIAGNOSIS — R041 Hemorrhage from throat: Secondary | ICD-10-CM | POA: Diagnosis not present

## 2017-12-07 DIAGNOSIS — J358 Other chronic diseases of tonsils and adenoids: Secondary | ICD-10-CM

## 2017-12-07 MED ORDER — SILVER NITRATE-POT NITRATE 75-25 % EX MISC
CUTANEOUS | Status: AC
  Filled 2017-12-07: qty 1

## 2017-12-07 MED ORDER — LIDOCAINE HCL 1 % IJ SOLN
1.0000 mL | Freq: Once | INTRAMUSCULAR | Status: AC
  Administered 2017-12-07: 1 mL
  Filled 2017-12-07: qty 1

## 2017-12-07 MED ORDER — "TRANEXAMIC ACID 5% ORAL SOLUTION "
10.0000 mL | Freq: Once | ORAL | Status: DC
  Filled 2017-12-07: qty 10

## 2017-12-07 NOTE — Progress Notes (Signed)
Patient had nebulized lidocaine for numbing

## 2017-12-07 NOTE — ED Provider Notes (Signed)
[facility] EMERGENCY DEPARTMENT Provider Note   CSN: [id number] Arrival date & time: 12/07/17  0014     History   Chief Complaint Chief Complaint  Patient presents with  . Tonsil Injury    right    HPI Jeanne Delgado is a 32 y.o. female.  The history is provided by the patient.  Sore Throat  This is a new problem. The current episode started 2 days ago. The problem occurs constantly. The problem has been gradually worsening. Pertinent negatives include no shortness of breath. The symptoms are aggravated by swallowing. The symptoms are relieved by rest.  Patient reports she has had sore throat for 2 days.  She reports her tonsils are always enlarged and will hurt frequently.  Tonight though she tasted blood in her mouth and  noted bleeding from the tonsil.  She has no hemoptysis.  No hematemesis   Past Medical History:  Diagnosis Date  . Abnormal Pap smear   . Irregular menses   . Obesity   . Vaginal Pap smear, abnormal     Patient Active Problem List   Diagnosis Date Noted  . Encounter for gynecological examination with Papanicolaou smear of cervix 05/30/2016  . Morbid obesity (HCC) 07/13/2014  . Gastroesophageal reflux disease with esophagitis 07/13/2014  . Irritable bowel syndrome 05/05/2014    History reviewed. No pertinent surgical history.   OB History    Gravida  1   Para      Term      Preterm      AB  1   Living        SAB  1   TAB      Ectopic      Multiple      Live Births               Home Medications    Prior to Admission medications   Not on File    Family History Family History  Problem Relation Age of Onset  . Cancer Other   . Diabetes Maternal Grandmother   . Liver disease Father     Social History Social History   Tobacco Use  . Smoking status: Current Every Day Smoker    Packs/day: 0.25    Types: Cigarettes  . Smokeless tobacco: Never Used  Substance Use Topics  . Alcohol use: Yes    Comment: rarely    . Drug use: No     Allergies   Dimetapp decongestant [pseudoephedrine]   Review of Systems Review of Systems  Constitutional: Negative for fever.  HENT: Positive for sore throat. Negative for voice change.   Respiratory: Negative for cough and shortness of breath.   Gastrointestinal: Negative for vomiting.  All other systems reviewed and are negative.    Physical Exam Updated Vital Signs BP 121/83 (BP Location: Right Arm)   Pulse 99   Temp 99.1 F (37.3 C) (Oral)   Resp 16   Ht 1.702 m (5\' 7" )   Wt 102.1 kg (225 lb)   LMP 11/04/2017   SpO2 99%   BMI 35.24 kg/m   Physical Exam CONSTITUTIONAL: Well developed/well nourished HEAD: Normocephalic/atraumatic EYES: EOMI ENMT: Mucous membranes moist, uvula midline, tonsils enlarged bilaterally, no exudates.  Right tonsil - small area of mild bleeding.  No drooling.  No stridor.  Normal phonation.  She is handling secretions NECK: supple no meningeal signs CV: S1/S2 noted, no murmurs/rubs/gallops noted LUNGS: Lungs are clear to auscultation bilaterally, no apparent distress ABDOMEN: soft  NEURO: Pt is awake/alert/appropriate, moves all extremitiesx4.  No facial droop.   EXTREMITIES:   full ROM SKIN: warm, color normal PSYCH: no abnormalities of mood noted, alert and oriented to situation   ED Treatments / Results  Labs (all labs ordered are listed, but only abnormal results are displayed) Labs Reviewed - No data to display  EKG None  Radiology No results found.  Procedures Procedures    Medications Ordered in ED Medications  lidocaine (XYLOCAINE) 1 % (with pres) injection 1 mL (has no administration in time range)  silver nitrate applicators 75-25 % applicator (has no administration in time range)     Initial Impression / Assessment and Plan / ED Course  I have reviewed the triage vital signs and the nursing notes.       Initial plan was to use nebulized lidocaine and then attempt to cauterize the  bleeding area.  After using nebulized lidocaine, the bleeding has resolved.  She is awake alert in no distress.  She is handling her secretions.  Bleeding has stopped and she is in no distress.  Will refer to ear nose and throat.  We discussed strict return precautions  Final Clinical Impressions(s) / ED Diagnoses   Final diagnoses:  Tonsillar bleed    ED Discharge Orders    None       Zadie RhineWickline, Adamari Frede, MD 12/07/17 [phone]

## 2017-12-07 NOTE — Discharge Instructions (Addendum)
If you Have any further bleeding, you can gargle ice water. If you have any worsening bleeding, unable to stop the bleeding please return to ER immediately

## 2017-12-07 NOTE — ED Triage Notes (Signed)
Pt c/o sore throat for 2 days . Then earlier pt coughed and noticed she tasted blood. Looked in mirror and saw her right tonsil was swollen. Reported she swished water and spat out a clot.  2/10 pain

## 2018-01-23 ENCOUNTER — Encounter: Payer: Self-pay | Admitting: Family Medicine

## 2018-01-23 ENCOUNTER — Ambulatory Visit: Payer: 59 | Admitting: Family Medicine

## 2018-01-23 VITALS — BP 106/70 | Ht 67.0 in | Wt 245.0 lb

## 2018-01-23 DIAGNOSIS — R079 Chest pain, unspecified: Secondary | ICD-10-CM | POA: Diagnosis not present

## 2018-01-23 DIAGNOSIS — R031 Nonspecific low blood-pressure reading: Secondary | ICD-10-CM | POA: Diagnosis not present

## 2018-01-23 DIAGNOSIS — F320 Major depressive disorder, single episode, mild: Secondary | ICD-10-CM

## 2018-01-23 DIAGNOSIS — R5383 Other fatigue: Secondary | ICD-10-CM | POA: Diagnosis not present

## 2018-01-23 MED ORDER — ESCITALOPRAM OXALATE 10 MG PO TABS: 10.0000 mg | ORAL_TABLET | Freq: Every day | ORAL | 2 refills | Status: DC

## 2018-01-23 NOTE — Progress Notes (Signed)
   Subjective:    Patient ID: Jeanne Delgado, female    DOB: [birth year], 32 y.o.   MRN: [id number]  HPIBp has been running low. Pt's readings yesterday  88/51, 90/62, 97/56. Feels tired and weak when its low.   Stress and mood swings. 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   Exercise these days minimal, going for walk feels tired t times  Ck ing bp at home  Numbers at work [employer] on  The low side  96 over 72  Then 97over 76 with pulse norml    No children, was married for fifteen yrs    Family and friends here and suportibe         Review of Systems No headache, no major weight loss or weight gain, no chest pain no back pain abdominal pain no change in bowel habits complete ROS otherwise negative     Objective:   Physical Exam Alert and oriented, vitals reviewed and stable, NAD ENT-TM's and ext canals WNL bilat via otoscopic exam Soft palate, tonsils and post pharynx WNL via oropharyngeal exam Neck-symmetric, no masses; thyroid nonpalpable and nontender Pulmonary-no tachypnea or accessory muscle use; Clear without wheezes via auscultation Card--no abnrml murmurs, rhythm reg and rate WNL Carotid pulses symmetric, without bruits Blood pressure on repeat 110/72 both arms       Assessment & Plan:  Impression borderline low blood pressure.  Think within normal limits for patient.  Discussed at length.  Not a source of her symptoms of fatigue trouble sleeping etc. EKG perfect.  Recommend no further work-up in this regard if patient wants to we can send her to a cardiologist but low yield situation discussed at length  2.  Depression, mild in nature and completely expected with grief the patient is experiencing loss  of with element of anxiety with recent separation and now divorced.  Points to a fair amount of stress.  Patient has used Lexapro in the past.  Would like to have some low dose for next several months to calm down symptoms which I think is reasonable/discuss them.  Warning signs discussed carefully  Greater than 31% of this 25 minute face to face visit was spent in counseling and discussion and coordination of care regarding the above diagnosis/diagnosies

## 2018-01-24 ENCOUNTER — Encounter: Payer: Self-pay | Admitting: Family Medicine

## 2018-02-07 DIAGNOSIS — Z30430 Encounter for insertion of intrauterine contraceptive device: Secondary | ICD-10-CM | POA: Diagnosis not present

## 2018-02-07 DIAGNOSIS — Z3200 Encounter for pregnancy test, result unknown: Secondary | ICD-10-CM | POA: Diagnosis not present

## 2018-02-10 DIAGNOSIS — R102 Pelvic and perineal pain: Secondary | ICD-10-CM | POA: Diagnosis not present

## 2018-03-12 ENCOUNTER — Telehealth: Payer: Self-pay | Admitting: Family Medicine

## 2018-03-12 NOTE — Telephone Encounter (Signed)
Patient was seen 8/22 for stress and was put on lexapro 10 mg. She states she is tired all the time and its not helping and causes stomach issues. She is requesting something else called into [facility] [address]

## 2018-03-12 NOTE — Telephone Encounter (Signed)
Please let the patient know that she can stop the medicine today Please inform her Dr. Brett Canales is out of the office today but will be back later this week It is best for him to decide the next step because of the complexities of the situation Please send this message to Dr. Brett Canales

## 2018-03-12 NOTE — Telephone Encounter (Signed)
Patient stated she wanted remind Dr Brett Canales that she doesn't want to be on Celexa or Cymbalta either and is stopping the Lexapro as advised by Dr Lorin Picket and will await further instructions from Dr Brett Canales.

## 2018-03-13 ENCOUNTER — Other Ambulatory Visit: Payer: Self-pay | Admitting: Family Medicine

## 2018-03-13 MED ORDER — BUPROPION HCL ER (SR) 150 MG PO TB12: ORAL_TABLET | ORAL | 3 refills | Status: DC

## 2018-03-13 NOTE — Telephone Encounter (Signed)
Left message to return call 

## 2018-03-13 NOTE — Telephone Encounter (Signed)
Pt returned call. Lexapro discontinued; sent in Wellbutrin to pharmacy. Pt verbalized understanding of directions.

## 2018-03-13 NOTE — Telephone Encounter (Signed)
Stop lexapro start wellbutrin 150 sr 150 qam for three d, then one bid 60 plus three ref

## 2018-04-15 ENCOUNTER — Ambulatory Visit: Payer: 59 | Admitting: Family Medicine

## 2018-04-15 ENCOUNTER — Encounter: Payer: Self-pay | Admitting: Family Medicine

## 2018-04-15 VITALS — Ht 67.0 in | Wt 244.8 lb

## 2018-04-15 DIAGNOSIS — S322XXA Fracture of coccyx, initial encounter for closed fracture: Secondary | ICD-10-CM

## 2018-04-15 DIAGNOSIS — M545 Low back pain: Secondary | ICD-10-CM | POA: Diagnosis not present

## 2018-04-15 MED ORDER — HYDROCODONE-ACETAMINOPHEN 5-325 MG PO TABS: ORAL_TABLET | ORAL | 0 refills | Status: DC

## 2018-04-15 MED ORDER — ETODOLAC 400 MG PO TABS: ORAL_TABLET | ORAL | 0 refills | Status: DC

## 2018-04-15 NOTE — Progress Notes (Signed)
   Subjective:    Patient ID: Jeanne Delgado, female    DOB: Jul 31, 1985, 32 y.o.   MRN: [id number]  Fall  The accident occurred more than 1 week ago. Fall occurred: at roll a bout. She landed on hard floor. The point of impact was the buttocks. The pain is present in the buttocks and back. She has tried ice and heat [employer], Flexeril) for the symptoms. The treatment provided no relief.  Patient took a fall at the rollabout Pt states it took her 20 minutes to get dressed this morning. No swelling in area. Pt also states that she feels pain and pressure when sitting. When she is standing upright, she feels fine but when she shifts she can feel the pressure. Pt states that it is more in her back rather than buttock area  [place] directly on rear end tat the roll about  No notiecable sewelling or bruisin  At time pain sharp other times achey, painful with pressure  No neurological symptoms. Having trouble moving now   No sig raeiation ;  ibu and tyl and meloxicam  Has helped a bit n but not a lot , takes 600 mg thre times per day  Review of Systems No headache, no major weight loss or weight gain, no chest pain no back pain abdominal pain no change in bowel habits complete ROS otherwise negative     Objective:   Physical Exam Alert vitals stable, NAD. Blood pressure good on repeat. HEENT normal. Lungs clear. Heart regular rate and rhythm. Distinct tenderness at coccyx       Assessment & Plan:  Impression coccyx fracture versus bruised coccyx.  Would prefer not to x-ray this young woman with future plan of having children.  Anti-inflammatory medicine prescribed.  Do not not position strongly encouraged.  Hydrocodone to use at night expect slow resolution

## 2018-04-17 ENCOUNTER — Ambulatory Visit: Payer: 59 | Admitting: Family Medicine

## 2018-06-12 ENCOUNTER — Encounter: Payer: Self-pay | Admitting: Family Medicine

## 2018-06-12 ENCOUNTER — Ambulatory Visit: Payer: 59 | Admitting: Family Medicine

## 2018-06-12 VITALS — BP 122/80 | Ht 67.0 in | Wt 247.0 lb

## 2018-06-12 DIAGNOSIS — S161XXA Strain of muscle, fascia and tendon at neck level, initial encounter: Secondary | ICD-10-CM

## 2018-06-12 DIAGNOSIS — M25512 Pain in left shoulder: Secondary | ICD-10-CM | POA: Diagnosis not present

## 2018-06-12 DIAGNOSIS — M25511 Pain in right shoulder: Secondary | ICD-10-CM | POA: Diagnosis not present

## 2018-06-12 MED ORDER — TIZANIDINE HCL 4 MG PO CAPS: ORAL_CAPSULE | ORAL | 0 refills | Status: DC

## 2018-06-12 MED ORDER — PHENTERMINE HCL 37.5 MG PO CAPS: ORAL_CAPSULE | ORAL | 2 refills | Status: DC

## 2018-06-12 MED ORDER — NABUMETONE 750 MG PO TABS: ORAL_TABLET | ORAL | 0 refills | Status: DC

## 2018-06-12 NOTE — Progress Notes (Signed)
   Subjective:    Patient ID: Jeanne Delgado, female    DOB: 11-Jun-1985, 33 y.o.   MRN: [id number]  HPI Patient arrives with right shoulder pain and spasms for a while. Patient states it is now causing pain and spasms in the collar bone area.  Tension in both shoulders , right worse than left  Had it yrs ago  Pt had massage  Was told "cant get thhe tissue of the bone"  Hx of collar bone fx   Shrugging shoulder its very painful  specificlly sought oust massage for tension in shoulder  orks in the clinical area    non in patient work   Pt does some walking and rowing machine andtries to use the sauna  Exercising two to three times per week  Recalls no specific overuse or injury  In the wek before k  Tail bone and coccyx still painful but much better   Pt has had cough and cong for the past month.  Patient notes that she is now living in a setting with potential exposure to mold.   Pt took adipex and was quite benefied by it,,  Had no noticeable side efe+  Despite patient's best efforts she continues to gain weight Review of Systems No headache, no major weight loss or weight gain, no chest pain no back pain abdominal pain no change in bowel habits complete ROS otherwise negative     Objective:   Physical Exam  Alert and oriented, vitals reviewed and stable, NAD ENT-TM's and ext canals WNL bilat via otoscopic exam Soft palate, tonsils and post pharynx WNL via oropharyngeal exam Neck-symmetric, no masses; thyroid nonpalpable and nontender Pulmonary-no tachypnea or accessory muscle use; Clear without wheezes via auscultation Card--no abnrml murmurs, rhythm reg and rate WNL Carotid pulses symmetric, without bruits Supraclavicular and suprascapular tenderness to deep palpation.  Right greater than left.  Negative impingement sign.  Positive pain with lateral rotation of the neck.     Assessment & Plan:  Impression 1 chronic cervical/trapezius/suprascapular  strain.  Discussed.  Highly doubt neuropathic element.  Discussed.  Hold off on major work-up including MRI etc.  Trial of Relafen along with Zanaflex.  Local measures discussed.  To continue massage  2.  Morbid obesity.  Discussed.  With patient motivated to lose weight.  Would like to resume phentermine.  Definitely helped in the past.  3 months worth prescribed.  Greater than 50% of this 25 minute face to face visit was spent in counseling and discussion and coordination of care regarding the above diagnosis/diagnosies

## 2018-07-21 ENCOUNTER — Other Ambulatory Visit: Payer: Self-pay | Admitting: *Deleted

## 2018-07-21 ENCOUNTER — Telehealth: Payer: Self-pay | Admitting: Family Medicine

## 2018-07-21 DIAGNOSIS — M25511 Pain in right shoulder: Secondary | ICD-10-CM

## 2018-07-21 MED ORDER — TIZANIDINE HCL 4 MG PO CAPS: ORAL_CAPSULE | ORAL | 0 refills | Status: DC

## 2018-07-21 NOTE — Telephone Encounter (Signed)
Was seen on 06-12-18 for Right Shoulder pain.  Still having problems and would like to get an xray. Also would like a refill on the Zanaflex.  [facility] [address]

## 2018-07-21 NOTE — Telephone Encounter (Signed)
Right shoulder xray order put in, refill on zanaflex sent to pharm. Pt notified of both.

## 2018-07-21 NOTE — Telephone Encounter (Signed)
Xray shoulder, refill zanaflex

## 2018-07-24 ENCOUNTER — Ambulatory Visit (HOSPITAL_COMMUNITY)
Admission: RE | Admit: 2018-07-24 | Discharge: 2018-07-24 | Disposition: A | Discharge status: Home / Self Care | Payer: 59 | Source: Ambulatory Visit | Attending: Family Medicine | Admitting: Family Medicine

## 2018-07-24 DIAGNOSIS — M25511 Pain in right shoulder: Secondary | ICD-10-CM | POA: Diagnosis not present

## 2018-09-05 ENCOUNTER — Ambulatory Visit: Payer: 59 | Admitting: Family Medicine

## 2018-09-18 DIAGNOSIS — Z30431 Encounter for routine checking of intrauterine contraceptive device: Secondary | ICD-10-CM | POA: Diagnosis not present

## 2018-09-18 DIAGNOSIS — N939 Abnormal uterine and vaginal bleeding, unspecified: Secondary | ICD-10-CM | POA: Diagnosis not present

## 2018-09-19 ENCOUNTER — Ambulatory Visit: Payer: 59 | Admitting: Family Medicine

## 2018-10-31 ENCOUNTER — Other Ambulatory Visit: Payer: 59

## 2018-10-31 ENCOUNTER — Telehealth: Payer: Self-pay | Admitting: *Deleted

## 2018-10-31 DIAGNOSIS — Z20822 Contact with and (suspected) exposure to covid-19: Secondary | ICD-10-CM

## 2018-10-31 NOTE — Telephone Encounter (Signed)
Contacted by Graciella Belton, Office Manger at [facility];  Dr Beryle Beams would like to have the pt tested for COVID;.the pt is present at the office; pt offered and accepted appointment at [facility] 10/31/2018 at 1030; pt is given address, location, and instructions that all occupants of her vehicle should wear a mask; she verbalizes understanding; orders placed per protocol; will route to provider for notification.   Pt  Information verified: [address] [place], [place] [address] [phone] No insurance

## 2018-11-02 LAB — NOVEL CORONAVIRUS, NAA: SARS-CoV-2, NAA: NOT DETECTED

## 2019-01-05 ENCOUNTER — Other Ambulatory Visit: Payer: Self-pay

## 2019-01-05 DIAGNOSIS — Z20822 Contact with and (suspected) exposure to covid-19: Secondary | ICD-10-CM

## 2019-01-06 LAB — NOVEL CORONAVIRUS, NAA: SARS-CoV-2, NAA: NOT DETECTED

## 2019-03-31 ENCOUNTER — Other Ambulatory Visit: Payer: Self-pay | Admitting: *Deleted

## 2019-03-31 DIAGNOSIS — Z20822 Contact with and (suspected) exposure to covid-19: Secondary | ICD-10-CM

## 2019-04-02 LAB — NOVEL CORONAVIRUS, NAA: SARS-CoV-2, NAA: NOT DETECTED

## 2019-06-01 ENCOUNTER — Ambulatory Visit: Payer: Self-pay | Attending: Internal Medicine

## 2019-06-08 ENCOUNTER — Ambulatory Visit: Payer: Self-pay | Attending: Internal Medicine

## 2019-06-08 ENCOUNTER — Other Ambulatory Visit: Payer: Self-pay

## 2019-06-08 DIAGNOSIS — Z20822 Contact with and (suspected) exposure to covid-19: Secondary | ICD-10-CM | POA: Insufficient documentation

## 2019-06-09 LAB — NOVEL CORONAVIRUS, NAA: SARS-CoV-2, NAA: NOT DETECTED

## 2019-07-06 ENCOUNTER — Encounter: Payer: Self-pay | Admitting: Family Medicine

## 2019-08-11 ENCOUNTER — Other Ambulatory Visit: Payer: Self-pay

## 2019-08-11 ENCOUNTER — Telehealth: Payer: Self-pay | Admitting: *Deleted

## 2019-08-11 ENCOUNTER — Ambulatory Visit (INDEPENDENT_AMBULATORY_CARE_PROVIDER_SITE_OTHER): Payer: Self-pay | Admitting: Family Medicine

## 2019-08-11 DIAGNOSIS — R5383 Other fatigue: Secondary | ICD-10-CM

## 2019-08-11 DIAGNOSIS — Z13220 Encounter for screening for lipoid disorders: Secondary | ICD-10-CM

## 2019-08-11 DIAGNOSIS — F320 Major depressive disorder, single episode, mild: Secondary | ICD-10-CM

## 2019-08-11 DIAGNOSIS — F411 Generalized anxiety disorder: Secondary | ICD-10-CM

## 2019-08-11 MED ORDER — VENLAFAXINE HCL 75 MG PO TABS: ORAL_TABLET | ORAL | 5 refills | Status: DC

## 2019-08-11 MED ORDER — CLONAZEPAM 0.5 MG PO TABS: ORAL_TABLET | ORAL | 0 refills | Status: DC

## 2019-08-11 NOTE — Telephone Encounter (Signed)
Dr Brett Canales wants pt called and scheduled an appt in 6-8 weeks with him

## 2019-08-11 NOTE — Progress Notes (Signed)
   Subjective:  Audio video  Patient ID: Jeanne Delgado, female    DOB: 1986-05-29, 34 y.o.   MRN: [id number]  Depression       has had 3 panic attacks in the past month. Uncontrollable crying, nausea, sob, felt like she was going to pass out.  Tried wellbutrin in the past but did not like the way it made her feel and stopped it a few months ago.   phq9 and gad 7 done.   Virtual Visit via Telephone Note  I connected with Ahria Slappey on 08/11/19 at  8:30 AM EST by telephone and verified that I am speaking with the correct person using two identifiers.  Location: Patient: home Provider: office   I discussed the limitations, risks, security and privacy concerns of performing an evaluation and management service by telephone and the availability of in person appointments. I also discussed with the patient that there may be a patient responsible charge related to this service. The patient expressed understanding and agreed to proceed.   History of Present Illness:    Observations/Objective:   Assessment and Plan:   Follow Up Instructions:    I discussed the assessment and treatment plan with the patient. The patient was provided an opportunity to ask questions and all were answered. The patient agreed with the plan and demonstrated an understanding of the instructions.   The patient was advised to call back or seek an in-person evaluation if the symptoms worsen or if the condition fails to improve as anticipated.  I provided 20 minutes of non-face-to-face time during this encounter.   No thoughts of self harm.  Chest anxiety.  Feeling down at times also.  Having intermittent panic attacks.  Not exercising much these days   Review of Systems  Psychiatric/Behavioral: Positive for depression.       Objective:   Physical Exam  Virtual      Assessment & Plan:  Impression generalized anxiety disorder with an element of depression.  Discussion held.  Did  not handle Wellbutrin well in the past.  Will initiate Zoloft.  Proper use discussed.  Warning signs discussed.  As needed Klonopin.  Recheck in several months.  Exercise encouraged.  Also appropriate blood work for fatigue

## 2019-08-12 NOTE — Telephone Encounter (Signed)
Patient scheduled follow up virtual visit on 4/20 at 8:40.

## 2019-08-25 ENCOUNTER — Telehealth: Payer: Self-pay | Admitting: Family Medicine

## 2019-08-25 NOTE — Telephone Encounter (Signed)
Pt is calling about venlafzxine. Pt started medication 1 1/2 weeks ago. She currently has no appetite and is down 10 lbs. She has been nauseous the last week. She has been taking zofran and eating before taking medication and it helps a little. The last two days she has been having dry heave spells   [facility] [address]

## 2019-08-26 NOTE — Telephone Encounter (Signed)
Ok that's reasonable stick with it and we will see

## 2019-08-26 NOTE — Telephone Encounter (Signed)
Called and discussed with pt. Pt wanted to ask doctor if she could continue med because the nausea has stopped and she has not taken zofran today. She still has not went up to full dose and wants to wait and see if when taking full dose if nausea comes back and if so she does want to change to paxil.

## 2019-08-26 NOTE — Telephone Encounter (Signed)
Sorry , needs to stop the med, is she willing to replacer with paxil 10 mg each morning--this is a cousin to lexapro that she has taken in the past and often helpful for her collection of symtoms. If so numb 30 one qam six ref

## 2019-08-27 NOTE — Telephone Encounter (Signed)
Patient notified and will call us back if any further problems.

## 2019-09-22 ENCOUNTER — Telehealth: Payer: Self-pay | Admitting: *Deleted

## 2019-09-22 ENCOUNTER — Telehealth (INDEPENDENT_AMBULATORY_CARE_PROVIDER_SITE_OTHER): Payer: Self-pay | Admitting: Family Medicine

## 2019-09-22 DIAGNOSIS — F419 Anxiety disorder, unspecified: Secondary | ICD-10-CM

## 2019-09-22 DIAGNOSIS — F339 Major depressive disorder, recurrent, unspecified: Secondary | ICD-10-CM

## 2019-09-22 MED ORDER — CLONAZEPAM 0.5 MG PO TABS: ORAL_TABLET | ORAL | 0 refills | Status: DC

## 2019-09-22 MED ORDER — SERTRALINE HCL 50 MG PO TABS: ORAL_TABLET | ORAL | 5 refills | Status: DC

## 2019-09-22 NOTE — Progress Notes (Signed)
   Subjective:    Patient ID: Jeanne Delgado, female    DOB: 1986-02-11, 34 y.o.   MRN: [id number]  HPIFollow up on depression. Pt stopped taking effexor due to side effects. States it was making her more emotional on the med than when she didn't take it.   phq9 and gad 7 done.   Virtual Visit via Telephone Note  I connected with Laira Penninger on 09/22/19 at  8:40 AM EDT by telephone and verified that I am speaking with the correct person using two identifiers.  Location: Patient: home Provider: office   I discussed the limitations, risks, security and privacy concerns of performing an evaluation and management service by telephone and the availability of in person appointments. I also discussed with the patient that there may be a patient responsible charge related to this service. The patient expressed understanding and agreed to proceed.   History of Present Illness:    Observations/Objective:   Assessment and Plan:   Follow Up Instructions:    I discussed the assessment and treatment plan with the patient. The patient was provided an opportunity to ask questions and all were answered. The patient agreed with the plan and demonstrated an understanding of the instructions.   The patient was advised to call back or seek an in-person evaluation if the symptoms worsen or if the condition fails to improve as anticipated.  I provided 23 minutes of non-face-to-face time during this encounter.  Got more emotional and nauseated on the effexor  Panic attacks better  Took lexapro in the past not sure how much else  Still having anxiety spells  Still having stress both at work and at home  Unfortunately not exercising         Review of Systems No headache no chest pain    Objective:   Physical Exam  Virtual      Assessment & Plan:  Impression generalized anxiety disorder with occasional panic attacks.  Also element of depression.  No suicidal  thoughts.  Stop Effexor.  Initiate Zoloft 50 every morning.  Maintain as needed clonazepam.  Strongly encouraged regular exercise.  Encourage getting blood work is already ordered.  Recheck in 4 months

## 2019-09-22 NOTE — Telephone Encounter (Signed)
Jeanne Delgado, Jeanne Delgado are scheduled for a virtual visit with your provider today.    Just as we do with appointments in the office, we must obtain your consent to participate.  Your consent will be active for this visit and any virtual visit you may have with one of our providers in the next 365 days.    If you have a MyChart account, I can also send a copy of this consent to you electronically.  All virtual visits are billed to your insurance company just like a traditional visit in the office.  As this is a virtual visit, video technology does not allow for your provider to perform a traditional examination.  This may limit your provider's ability to fully assess your condition.  If your provider identifies any concerns that need to be evaluated in person or the need to arrange testing such as labs, EKG, etc, we will make arrangements to do so.    Although advances in technology are sophisticated, we cannot ensure that it will always work on either your end or our end.  If the connection with a video visit is poor, we may have to switch to a telephone visit.  With either a video or telephone visit, we are not always able to ensure that we have a secure connection.   I need to obtain your verbal consent now.   Are you willing to proceed with your visit today?   Jeanne Delgado has provided verbal consent on 09/22/2019 for a virtual visit (video or telephone).   Kyra Manges, LPN 4/65/6812  7:51 AM

## 2019-09-29 ENCOUNTER — Encounter: Payer: Self-pay | Admitting: Family Medicine

## 2019-10-01 ENCOUNTER — Encounter: Payer: Self-pay | Admitting: Family Medicine

## 2019-10-01 ENCOUNTER — Telehealth (INDEPENDENT_AMBULATORY_CARE_PROVIDER_SITE_OTHER): Payer: Self-pay | Admitting: Family Medicine

## 2019-10-01 ENCOUNTER — Telehealth: Payer: Self-pay | Admitting: Family Medicine

## 2019-10-01 DIAGNOSIS — J020 Streptococcal pharyngitis: Secondary | ICD-10-CM

## 2019-10-01 MED ORDER — AZITHROMYCIN 250 MG PO TABS: ORAL_TABLET | ORAL | 0 refills | Status: DC

## 2019-10-01 NOTE — Progress Notes (Signed)
   Subjective:    Patient ID: Jeanne Delgado, female    DOB: Aug 09, 1985, 34 y.o.   MRN: [id number] Initially this was a video visit but then patient was brought to the office for further evaluation Sore Throat  This is a new problem. Episode onset: 2 days  Associated symptoms comments: Cough, swollen tonsils with white specs, headache (pt believes it is due to shoulder spasms). Treatments tried: Claritin  The treatment provided mild relief.   Virtual Visit via Video Note  I connected with Jeanne Delgado on 10/01/19 at 10:00 AM EDT by a video enabled telemedicine application and verified that I am speaking with the correct person using two identifiers.  Location: Patient: home Provider: office   I discussed the limitations of evaluation and management by telemedicine and the availability of in person appointments. The patient expressed understanding and agreed to proceed.  History of Present Illness:    Observations/Objective:   Assessment and Plan:   Follow Up Instructions:    I discussed the assessment and treatment plan with the patient. The patient was provided an opportunity to ask questions and all were answered. The patient agreed with the plan and demonstrated an understanding of the instructions.   The patient was advised to call back or seek an in-person evaluation if the symptoms worsen or if the condition fails to improve as anticipated.  I provided 20 minutes of non-face-to-face time during this encounter.    Past couple days sore throat some difficulty when she swallows    Review of Systems Patient states at times she feels a popping in her ear she is uncertain if this is her heartbeat this occurs off and on the past 6 months    Objective:   Physical Exam  Throat erythematous enlarged tonsils with white splotches on it eardrums are normal lungs are clear heart is regular      Assessment & Plan:  Pharyngitis issues Probable strep Antibiotics  prescribed Warning signs discussed Follow-up if progressive troubles or worse  Finally patient was encouraged to see ENT for her tonsils and for the popping noise in her ear she states she will call back to let us know when she is ready for the consult

## 2019-10-01 NOTE — Telephone Encounter (Signed)
Pt gives verbal consent for video visit

## 2020-01-13 ENCOUNTER — Other Ambulatory Visit: Payer: Self-pay

## 2020-01-13 ENCOUNTER — Encounter: Payer: Self-pay | Admitting: Family Medicine

## 2020-01-13 ENCOUNTER — Ambulatory Visit (INDEPENDENT_AMBULATORY_CARE_PROVIDER_SITE_OTHER): Payer: Self-pay | Admitting: Family Medicine

## 2020-01-13 ENCOUNTER — Ambulatory Visit: Payer: Self-pay | Admitting: Family Medicine

## 2020-01-13 DIAGNOSIS — R05 Cough: Secondary | ICD-10-CM

## 2020-01-13 DIAGNOSIS — B349 Viral infection, unspecified: Secondary | ICD-10-CM

## 2020-01-13 DIAGNOSIS — R059 Cough, unspecified: Secondary | ICD-10-CM

## 2020-01-13 DIAGNOSIS — K2100 Gastro-esophageal reflux disease with esophagitis, without bleeding: Secondary | ICD-10-CM

## 2020-01-13 MED ORDER — PANTOPRAZOLE SODIUM 40 MG PO TBEC: 40.0000 mg | DELAYED_RELEASE_TABLET | Freq: Every day | ORAL | 3 refills | Status: DC

## 2020-01-13 NOTE — Progress Notes (Signed)
   Subjective:    Patient ID: Jeanne Delgado, female    DOB: 09-20-1985, 34 y.o.   MRN: [id number]   Patient comes in with complaints of runny nose and watery eyes for several day.  She relates head congestion drainage and some coughing no wheezing or difficulty breathing denies high fevers body aches energy level subpar  Patient also experiencing vomiting several times per day associated with chest tightness and burning, she feels this could be related to her acid reflux, Prilosec has not helped at all.   Exposed to RSV over the weekend.  She denies any Covid exposure     Objective:   Physical Exam Does not appear toxic eardrums are normal minimal sinus tenderness throat is normal lungs clear heart regular       Assessment & Plan:  Viral syndrome Covid test sent Rest today tomorrow No antibiotics indicated Warning signs discussed Follow-up if problems  Reflux Protonix 1 daily after 3 months if doing well reduce it to Pepcid either 20 mg or 40 mg daily

## 2020-01-15 LAB — SARS-COV-2, NAA 2 DAY TAT

## 2020-01-15 LAB — NOVEL CORONAVIRUS, NAA: SARS-CoV-2, NAA: NOT DETECTED

## 2020-03-25 ENCOUNTER — Telehealth: Payer: Self-pay

## 2020-03-25 NOTE — Telephone Encounter (Signed)
Error

## 2020-03-29 ENCOUNTER — Encounter: Payer: Self-pay | Admitting: Family Medicine

## 2020-03-29 ENCOUNTER — Ambulatory Visit (INDEPENDENT_AMBULATORY_CARE_PROVIDER_SITE_OTHER): Payer: Self-pay | Admitting: Family Medicine

## 2020-03-29 ENCOUNTER — Other Ambulatory Visit: Payer: Self-pay

## 2020-03-29 VITALS — BP 118/74 | HR 102 | Temp 97.5°F | Ht 67.0 in | Wt 242.0 lb

## 2020-03-29 DIAGNOSIS — E669 Obesity, unspecified: Secondary | ICD-10-CM

## 2020-03-29 DIAGNOSIS — F419 Anxiety disorder, unspecified: Secondary | ICD-10-CM

## 2020-03-29 DIAGNOSIS — N926 Irregular menstruation, unspecified: Secondary | ICD-10-CM

## 2020-03-29 DIAGNOSIS — F32A Depression, unspecified: Secondary | ICD-10-CM

## 2020-03-29 LAB — POCT URINE PREGNANCY: Preg Test, Ur: NEGATIVE

## 2020-03-29 MED ORDER — ONDANSETRON 4 MG PO TBDP: 4.0000 mg | ORAL_TABLET | Freq: Three times a day (TID) | ORAL | 0 refills | Status: DC | PRN

## 2020-03-29 MED ORDER — PHENTERMINE HCL 37.5 MG PO CAPS: 37.5000 mg | ORAL_CAPSULE | ORAL | 0 refills | Status: DC

## 2020-03-29 MED ORDER — PAROXETINE HCL 10 MG PO TABS: 10.0000 mg | ORAL_TABLET | Freq: Every day | ORAL | 1 refills | Status: DC

## 2020-03-29 NOTE — Progress Notes (Signed)
Patient ID: Jeanne Delgado, female    DOB: 10/28/1985, 34 y.o.   MRN: [id number]   Chief Complaint  Patient presents with  . Anxiety   Subjective:    HPI  F/u anxiety. Stopped taking klonopin because she felt it made her angry. Stopped zoloft due to it causing nausea and diarrhea.    Felt like made her irritable when taking klonapin and didn't make her feel more relaxed.  Tried it for a while.  Then tried zoloft.  Had some nausea and diarrhea. Took for 2-3 weeks and then dc.   Getting very irritable quickly.  Anxiety and depression. Has been on/off last 4 yrs.  Had divorce 4 yrs ago. Tried lexapro, effexor, wellbutrin, in past. lexapro did ok, had GI upset and headaches.  wellbutrin- made her sleepy. Effexor.- 3/21-4/21. Stopped and started on zoloft 50mg .  Wanting to discuss weight issues- Tried phentermine in past.  Lost some weight with it, and wanting to see about getting back on it for a couple months.   LMP- 3 wks ago. And irregular.  Not had any unprotected intercourse.   Medical History Torri has a past medical history of Abnormal Pap smear, Irregular menses, Obesity, and Vaginal Pap smear, abnormal.   Outpatient Encounter Medications as of 03/29/2020  Medication Sig  . omeprazole (PRILOSEC) 20 MG capsule Take 20 mg by mouth daily.   . pantoprazole (PROTONIX) 40 MG tablet Take 1 tablet (40 mg total) by mouth daily.  . ondansetron (ZOFRAN-ODT) 4 MG disintegrating tablet Take 1 tablet (4 mg total) by mouth every 8 (eight) hours as needed for nausea or vomiting.  03/31/2020 PARoxetine (PAXIL) 10 MG tablet Take 1 tablet (10 mg total) by mouth daily.  . phentermine 37.5 MG capsule Take 1 capsule (37.5 mg total) by mouth every morning.  . phentermine 37.5 MG capsule Take 1 capsule (37.5 mg total) by mouth every morning.  . phentermine 37.5 MG capsule Take 1 capsule (37.5 mg total) by mouth every morning.  . [DISCONTINUED] clonazePAM (KLONOPIN) 0.5 MG tablet Take one tablet  po qd prn panic attack. Caution drowsiness  . [DISCONTINUED] sertraline (ZOLOFT) 50 MG tablet Take one tablet qam   No facility-administered encounter medications on file as of 03/29/2020.     Review of Systems  Constitutional: Negative for chills and fever.  HENT: Negative for congestion, rhinorrhea and sore throat.   Respiratory: Negative for cough, shortness of breath and wheezing.   Cardiovascular: Negative for chest pain and leg swelling.  Gastrointestinal: Negative for abdominal pain, diarrhea, nausea and vomiting.  Genitourinary: Negative for dysuria and frequency.  Musculoskeletal: Negative for arthralgias and back pain.  Skin: Negative for rash.  Neurological: Negative for dizziness, weakness and headaches.  Psychiatric/Behavioral: Negative for agitation, decreased concentration, dysphoric mood, sleep disturbance and suicidal ideas. The patient is not nervous/anxious.      Vitals BP 118/74   Pulse (!) 102   Temp (!) 97.5 F (36.4 C)   Ht 5\' 7"  (1.702 m)   Wt 242 lb (109.8 kg)   SpO2 99%   BMI 37.90 kg/m   Objective:   Physical Exam Vitals and nursing note reviewed.  Constitutional:      Appearance: Normal appearance.  HENT:     Head: Normocephalic and atraumatic.     Nose: Nose normal.     Mouth/Throat:     Mouth: Mucous membranes are moist.     Pharynx: Oropharynx is clear.  Eyes:     Extraocular Movements:  Extraocular movements intact.     Conjunctiva/sclera: Conjunctivae normal.     Pupils: Pupils are equal, round, and reactive to light.  Cardiovascular:     Rate and Rhythm: Regular rhythm. Tachycardia present.     Pulses: Normal pulses.     Heart sounds: Normal heart sounds.  Pulmonary:     Effort: Pulmonary effort is normal.     Breath sounds: Normal breath sounds. No wheezing, rhonchi or rales.  Musculoskeletal:        General: Normal range of motion.     Right lower leg: No edema.     Left lower leg: No edema.  Skin:    General: Skin is warm  and dry.     Findings: No lesion or rash.  Neurological:     General: No focal deficit present.     Mental Status: She is alert and oriented to person, place, and time.  Psychiatric:        Mood and Affect: Mood normal.        Behavior: Behavior normal.        Thought Content: Thought content normal.        Judgment: Judgment normal.      Assessment and Plan   1. Anxiety - PARoxetine (PAXIL) 10 MG tablet; Take 1 tablet (10 mg total) by mouth daily.  Dispense: 30 tablet; Refill: 1  2. Irregular periods - POCT urine pregnancy  3. Obesity (BMI 35.0-39.9 without comorbidity) - phentermine 37.5 MG capsule; Take 1 capsule (37.5 mg total) by mouth every morning.  Dispense: 30 capsule; Refill: 0 - phentermine 37.5 MG capsule; Take 1 capsule (37.5 mg total) by mouth every morning.  Dispense: 30 capsule; Refill: 0 - phentermine 37.5 MG capsule; Take 1 capsule (37.5 mg total) by mouth every morning.  Dispense: 30 capsule; Refill: 0  4. Depression, unspecified depression type - PARoxetine (PAXIL) 10 MG tablet; Take 1 tablet (10 mg total) by mouth daily.  Dispense: 30 tablet; Refill: 1    gerd-stable. Taking protonix daily. prilosec occ. Not having to take it.  Depression/anxiety- will try paxil and see if can help.  zofran given in case of nausea with paxil.   Obesity- Discussed starting the phentermine after a week or 2 on the paxil to make sure not having a side effect. Obtained urine preg today- negative. Gave 3 month trial of phentermine.  Pt had this in past no difficulties.  F/u 43mo for recheck weight/phentermine.  F/u 4 wk for phone visit about depression/anxiety meds.

## 2020-04-10 ENCOUNTER — Encounter: Payer: Self-pay | Admitting: Family Medicine

## 2020-04-26 ENCOUNTER — Telehealth: Payer: Self-pay

## 2020-04-26 ENCOUNTER — Telehealth (INDEPENDENT_AMBULATORY_CARE_PROVIDER_SITE_OTHER): Payer: Self-pay | Admitting: Family Medicine

## 2020-04-26 ENCOUNTER — Other Ambulatory Visit: Payer: Self-pay

## 2020-04-26 DIAGNOSIS — F419 Anxiety disorder, unspecified: Secondary | ICD-10-CM

## 2020-04-26 DIAGNOSIS — K2100 Gastro-esophageal reflux disease with esophagitis, without bleeding: Secondary | ICD-10-CM

## 2020-04-26 DIAGNOSIS — Z7689 Persons encountering health services in other specified circumstances: Secondary | ICD-10-CM

## 2020-04-26 DIAGNOSIS — F32A Depression, unspecified: Secondary | ICD-10-CM

## 2020-04-26 MED ORDER — PAROXETINE HCL 10 MG PO TABS: 10.0000 mg | ORAL_TABLET | Freq: Every day | ORAL | 1 refills | Status: DC

## 2020-04-26 MED ORDER — PHENTERMINE HCL 37.5 MG PO TABS: 37.5000 mg | ORAL_TABLET | Freq: Every day | ORAL | 1 refills | Status: DC

## 2020-04-26 MED ORDER — FAMOTIDINE 40 MG PO TABS: 40.0000 mg | ORAL_TABLET | Freq: Every day | ORAL | 2 refills | Status: DC

## 2020-04-26 NOTE — Telephone Encounter (Signed)
Pt forgot to tell the dr this morning that she is has blood in her stool.  Pt call back [phone]

## 2020-04-26 NOTE — Progress Notes (Signed)
Patient ID: Jeanne Delgado, female    DOB: August 26, 1985, 34 y.o.   MRN: [id number]   Virtual Visit via Video Note  I connected with Jeanne Delgado on 04/26/20 at  9:00 AM EST by a video enabled telemedicine application and verified that I am speaking with the correct person using two identifiers.  Location: Patient: home Provider: office   I discussed the limitations of evaluation and management by telemedicine and the availability of in person appointments. The patient expressed understanding and agreed to proceed.   Chief Complaint  Patient presents with  . Anxiety   Subjective:    HPI follow up on anxiety and depression. Doing well on paroxetine.   concerns about acid reflux. Taking protonix but had a bad epidsode this morning.   Pt states she has lost about 9 lbs so far since being on phentermine. Would like to see if she can switch from capsule to tablets. States capsules make her mouth dry.   Pt doing well with the paxil for anxiety.  Has felt her sleep has improved and not having the racing thoughts and anxiety. Has slight nausea in beginning but has resolved.  Pt having reflux early in AM and feling some pain in stomach and then reflux causing some hoarseness in am. Has not seen GI for this.  No h/o EGD in past. Happening 1-2x per week since being on the protonix the last month. Not tried other meds yet. No blood or black stools. Has changed her diet and lost some weight.    Medical History Jeanne Delgado has a past medical history of Abnormal Pap smear, Irregular menses, Obesity, and Vaginal Pap smear, abnormal.   Outpatient Encounter Medications as of 04/26/2020  Medication Sig  . ondansetron (ZOFRAN-ODT) 4 MG disintegrating tablet Take 1 tablet (4 mg total) by mouth every 8 (eight) hours as needed for nausea or vomiting.  . pantoprazole (PROTONIX) 40 MG tablet Take 1 tablet (40 mg total) by mouth daily.  Marland Kitchen PARoxetine (PAXIL) 10 MG tablet Take 1 tablet (10 mg  total) by mouth daily.  . phentermine 37.5 MG capsule Take 1 capsule (37.5 mg total) by mouth every morning.  . phentermine 37.5 MG capsule Take 1 capsule (37.5 mg total) by mouth every morning.  . phentermine 37.5 MG capsule Take 1 capsule (37.5 mg total) by mouth every morning.  . [DISCONTINUED] omeprazole (PRILOSEC) 20 MG capsule Take 20 mg by mouth daily.    No facility-administered encounter medications on file as of 04/26/2020.     Review of Systems  Constitutional: Negative for chills and fever.  HENT: Negative for congestion, rhinorrhea and sore throat.   Respiratory: Negative for cough, shortness of breath and wheezing.   Cardiovascular: Negative for chest pain and leg swelling.  Gastrointestinal: Positive for abdominal pain (with reflux). Negative for diarrhea, nausea and vomiting.  Genitourinary: Negative for dysuria and frequency.  Musculoskeletal: Negative for arthralgias and back pain.  Skin: Negative for rash.  Neurological: Negative for dizziness, weakness and headaches.  Psychiatric/Behavioral: Negative for dysphoric mood and sleep disturbance. The patient is nervous/anxious (improving).      Vitals There were no vitals taken for this visit.  Objective:   Physical Exam  Virtual visit, limited PE.  NAD. No resp distress. Mood normal, thought and judgment normal.  Assessment and Plan   1. Anxiety  2. Gastroesophageal reflux disease with esophagitis without hemorrhage  3. Encounter for weight management    Anxiety- doing well with paxil. Wanting to  stay at 10mg  dose at this time.  gerd-not controlled with 40mg  protonix.  Will add famotadine 40mg  qhs.  Can add tums or maalox prn. Cont to watch diet and avoid caffeine, alcohol, greasy and spicy foods.  Weight loss- doing well and had 11 lbs wt loss, will cont with phentermine.  Will change from capsule to tablet, due to dry mouth.  F/u 8 wks for recheck on weight, gerd, and anxiety.   Follow Up  Instructions:    I discussed the assessment and treatment plan with the patient. The patient was provided an opportunity to ask questions and all were answered. The patient agreed with the plan and demonstrated an understanding of the instructions.   The patient was advised to call back or seek an in-person evaluation if the symptoms worsen or if the condition fails to improve as anticipated.  I provided 12 minutes of non-face-to-face time during this encounter.

## 2020-04-26 NOTE — Telephone Encounter (Signed)
Pt was in the office today and was seen by Clydie Braun

## 2020-04-26 NOTE — Telephone Encounter (Signed)
Needs in person appt to evaluate this, thx, dr. Ladona Ridgel

## 2020-05-10 ENCOUNTER — Telehealth: Payer: Self-pay

## 2020-05-10 NOTE — Telephone Encounter (Signed)
Needs visit first. Please call and schedule an appt

## 2020-05-10 NOTE — Telephone Encounter (Signed)
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 Wants to be sent to a ear nose and throat specialist.  Pt call back [phone]  Work [phone] ex [phone]

## 2020-05-16 ENCOUNTER — Ambulatory Visit: Payer: Self-pay | Admitting: Family Medicine

## 2020-06-29 ENCOUNTER — Encounter: Payer: Self-pay | Admitting: Family Medicine

## 2020-06-29 ENCOUNTER — Other Ambulatory Visit: Payer: Self-pay

## 2020-06-29 ENCOUNTER — Ambulatory Visit (INDEPENDENT_AMBULATORY_CARE_PROVIDER_SITE_OTHER): Payer: Self-pay | Admitting: Family Medicine

## 2020-06-29 VITALS — BP 118/74 | HR 102 | Temp 98.3°F | Ht 67.0 in | Wt 230.0 lb

## 2020-06-29 DIAGNOSIS — M25512 Pain in left shoulder: Secondary | ICD-10-CM

## 2020-06-29 DIAGNOSIS — K2100 Gastro-esophageal reflux disease with esophagitis, without bleeding: Secondary | ICD-10-CM

## 2020-06-29 DIAGNOSIS — F419 Anxiety disorder, unspecified: Secondary | ICD-10-CM

## 2020-06-29 DIAGNOSIS — F32A Depression, unspecified: Secondary | ICD-10-CM

## 2020-06-29 DIAGNOSIS — E6609 Other obesity due to excess calories: Secondary | ICD-10-CM

## 2020-06-29 DIAGNOSIS — Z6836 Body mass index (BMI) 36.0-36.9, adult: Secondary | ICD-10-CM

## 2020-06-29 MED ORDER — PANTOPRAZOLE SODIUM 40 MG PO TBEC: 40.0000 mg | DELAYED_RELEASE_TABLET | Freq: Every day | ORAL | 3 refills | Status: DC

## 2020-06-29 MED ORDER — PAROXETINE HCL 20 MG PO TABS: 10.0000 mg | ORAL_TABLET | Freq: Every day | ORAL | 1 refills | Status: DC

## 2020-06-29 MED ORDER — PHENTERMINE HCL 37.5 MG PO TABS: 37.5000 mg | ORAL_TABLET | Freq: Every day | ORAL | 0 refills | Status: DC

## 2020-06-29 MED ORDER — MELOXICAM 7.5 MG PO TABS: 7.5000 mg | ORAL_TABLET | Freq: Every day | ORAL | 1 refills | Status: DC

## 2020-06-29 NOTE — Progress Notes (Signed)
Patient ID: Jeanne Delgado, female    DOB: 1985-10-21, 35 y.o.   MRN: [id number]   Chief Complaint  Patient presents with  . Weight Check  . Anxiety  . Gastroesophageal Reflux   Subjective:    HPI  CC- med check up for weight loss, anxiety, gerd.  Pt states weight loss is going well but does not feel like paxil is working.  Pt was taking 10mg  paxil.  Having more better days than she was. Could be work stress. Starting crying not able to control it.  Tension in shoulders. 1-2x per week.   Lost best friend , 2x and uncle passed recently. Pt tried clonazepam and felt more irritation. Not wanting benzos.  Had seen counseling. 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   Pt went back to taking phentermine last visit, for weight loss.  Has been helping. 242 and now down to 230 lbs.  Going back to gym some, 2-3x per week.  Having some chest pain and lasted about 10 mins only 2x in past few months.  Feeling her anxiety.   Tension in left shoulder, about 41mo ago. Had deep muscle massage.  Medical History Kiley has a past medical history of Abnormal Pap smear, Irregular menses, Obesity, and Vaginal Pap smear, abnormal.   Outpatient Encounter Medications as of 06/29/2020  Medication Sig  . famotidine (PEPCID) 40 MG tablet Take 1 tablet (40 mg total) by mouth at bedtime.  . meloxicam (MOBIC) 7.5 MG tablet Take 1 tablet (7.5 mg total) by mouth daily.  . ondansetron (ZOFRAN-ODT) 4 MG disintegrating tablet Take 1 tablet (4 mg total) by mouth every 8 (eight) hours as needed for nausea or vomiting.  . [DISCONTINUED] pantoprazole (PROTONIX) 40 MG tablet Take 1 tablet (40 mg total) by mouth daily.  . [DISCONTINUED] PARoxetine (PAXIL) 10 MG tablet Take 1 tablet (10 mg total) by mouth daily.  . [DISCONTINUED] phentermine (ADIPEX-P) 37.5 MG tablet Take 1 tablet (37.5 mg total) by mouth daily before breakfast.  . pantoprazole (PROTONIX) 40 MG tablet Take 1  tablet (40 mg total) by mouth daily.  07/01/2020 PARoxetine (PAXIL) 20 MG tablet Take 1 tablet (20 mg total) by mouth daily.  . phentermine (ADIPEX-P) 37.5 MG tablet Take 1 tablet (37.5 mg total) by mouth daily before breakfast.  . [DISCONTINUED] PARoxetine (PAXIL) 20 MG tablet Take 0.5 tablets (10 mg total) by mouth daily.   No facility-administered encounter medications on file as of 06/29/2020.     Review of Systems  Constitutional: Negative for chills and fever.  HENT: Negative for congestion, rhinorrhea and sore throat.   Respiratory: Negative for cough, shortness of breath and wheezing.   Cardiovascular: Negative for chest pain and leg swelling.  Gastrointestinal: Negative for abdominal pain, diarrhea, nausea and vomiting.  Genitourinary: Negative for dysuria and frequency.  Musculoskeletal: Positive for arthralgias (left shoulder pain). Negative for back pain.  Skin: Negative for rash.  Neurological: Negative for dizziness, weakness and headaches.     Vitals BP 118/74   Pulse (!) 102   Temp 98.3 F (36.8 C)   Ht 5\' 7"  (1.702 m)   Wt 230 lb (104.3 kg)   SpO2 97%   BMI 36.02 kg/m   Objective:   Physical Exam Vitals and nursing note reviewed.  Constitutional:      General: She is not in acute distress.    Appearance: Normal appearance. She is obese. She is not ill-appearing.  HENT:  Head: Normocephalic and atraumatic.     Nose: Nose normal.     Mouth/Throat:     Mouth: Mucous membranes are moist.     Pharynx: Oropharynx is clear.  Eyes:     Extraocular Movements: Extraocular movements intact.     Conjunctiva/sclera: Conjunctivae normal.     Pupils: Pupils are equal, round, and reactive to light.  Cardiovascular:     Rate and Rhythm: Normal rate and regular rhythm.     Pulses: Normal pulses.     Heart sounds: Normal heart sounds.  Pulmonary:     Effort: Pulmonary effort is normal.     Breath sounds: Normal breath sounds. No wheezing, rhonchi or rales.   Musculoskeletal:        General: Normal range of motion.     Right lower leg: No edema.     Left lower leg: No edema.  Skin:    General: Skin is warm and dry.     Findings: No lesion or rash.  Neurological:     General: No focal deficit present.     Mental Status: She is alert and oriented to person, place, and time.  Psychiatric:        Mood and Affect: Mood normal.        Behavior: Behavior normal.        Thought Content: Thought content normal.        Judgment: Judgment normal.      Assessment and Plan   1. Anxiety - PARoxetine (PAXIL) 20 MG tablet; Take 1 tablet (20 mg total) by mouth daily.  Dispense: 30 tablet; Refill: 1  2. Depression, unspecified depression type - PARoxetine (PAXIL) 20 MG tablet; Take 1 tablet (20 mg total) by mouth daily.  Dispense: 30 tablet; Refill: 1  3. Gastroesophageal reflux disease with esophagitis without hemorrhage - pantoprazole (PROTONIX) 40 MG tablet; Take 1 tablet (40 mg total) by mouth daily.  Dispense: 30 tablet; Refill: 3  4. Class 2 obesity due to excess calories without serious comorbidity with body mass index (BMI) of 36.0 to 36.9 in adult - phentermine (ADIPEX-P) 37.5 MG tablet; Take 1 tablet (37.5 mg total) by mouth daily before breakfast.  Dispense: 30 tablet; Refill: 0  5. Acute pain of left shoulder   Anxiety/depression- inc from 10mg  paxil to 20mg  daily of paxil.  gerd- doing well, stable. Cont protonix.   Weight loss management/obesity- one more month of phentermine.  Will recheck next visit. Pt to cont diet changes and exercising.  Body mass index is 36.02 kg/m.   gerd- stable. Cont meds.   F/u 4 wks.

## 2020-07-04 ENCOUNTER — Other Ambulatory Visit: Payer: Self-pay

## 2020-07-04 ENCOUNTER — Encounter: Payer: Self-pay | Admitting: Family Medicine

## 2020-07-04 ENCOUNTER — Ambulatory Visit (INDEPENDENT_AMBULATORY_CARE_PROVIDER_SITE_OTHER): Payer: Self-pay | Admitting: Family Medicine

## 2020-07-04 VITALS — HR 84 | Temp 98.0°F | Resp 16

## 2020-07-04 DIAGNOSIS — H66001 Acute suppurative otitis media without spontaneous rupture of ear drum, right ear: Secondary | ICD-10-CM | POA: Insufficient documentation

## 2020-07-04 DIAGNOSIS — H60502 Unspecified acute noninfective otitis externa, left ear: Secondary | ICD-10-CM

## 2020-07-04 MED ORDER — OFLOXACIN 0.3 % OT SOLN: 5.0000 [drp] | Freq: Every day | OTIC | 0 refills | Status: DC

## 2020-07-04 MED ORDER — AMOXICILLIN-POT CLAVULANATE 875-125 MG PO TABS: 1.0000 | ORAL_TABLET | Freq: Two times a day (BID) | ORAL | 0 refills | Status: DC

## 2020-07-04 NOTE — Patient Instructions (Signed)
Otitis Externa  Otitis externa is an infection of the outer ear canal. The outer ear canal is the area between the outside of the ear and the eardrum. Otitis externa is sometimes called swimmer's ear. What are the causes? Common causes of this condition include:  Swimming in dirty water.  Moisture in the ear.  An injury to the inside of the ear.  An object stuck in the ear.  A cut or scrape on the outside of the ear. What increases the risk? You are more likely to get this condition if you go swimming often. What are the signs or symptoms?  Itching in the ear. This is often the first symptom.  Swelling of the ear.  Redness in the ear.  Ear pain. The pain may get worse when you pull on your ear.  Pus coming from the ear. How is this treated? This condition may be treated with:  Antibiotic ear drops. These are often given for 10-14 days.  Medicines to reduce itching and swelling. Follow these instructions at home:  If you were given antibiotic ear drops, use them as told by your doctor. Do not stop using them even if your condition gets better.  Take over-the-counter and prescription medicines only as told by your doctor.  Avoid getting water in your ears as told by your doctor. You may be told to avoid swimming or water sports for a few days.  Keep all follow-up visits as told by your doctor. This is important. How is this prevented?  Keep your ears dry. Use the corner of a towel to dry your ears after you swim or bathe.  Try not to scratch or put things in your ear. Doing these things makes it easier for germs to grow in your ear.  Avoid swimming in lakes, dirty water, or pools that may not have the right amount of a chemical called chlorine. Contact a doctor if:  You have a fever.  Your ear is still red, swollen, or painful after 3 days.  You still have pus coming from your ear after 3 days.  Your redness, swelling, or pain gets worse.  You have a really  bad headache.  You have redness, swelling, pain, or tenderness behind your ear. Summary  Otitis externa is an infection of the outer ear canal.  Symptoms include pain, redness, and swelling of the ear.  If you were given antibiotic ear drops, use them as told by your doctor. Do not stop using them even if your condition gets better.  Try not to scratch or put things in your ear. This information is not intended to replace advice given to you by your health care provider. Make sure you discuss any questions you have with your health care provider. Document Revised: 10/25/2017 Document Reviewed: 10/25/2017 Elsevier Patient Education  2021 [employer]. Otitis Media, Adult  Otitis media is a condition in which the middle ear is red and swollen (inflamed) and full of fluid. The middle ear is the part of the ear that contains bones for hearing as well as air that helps send sounds to the brain. The condition usually goes away on its own. What are the causes? This condition is caused by a blockage in the eustachian tube. The eustachian tube connects the middle ear to the back of the nose. It normally allows air into the middle ear. The blockage is caused by fluid or swelling. Problems that can cause blockage include:  A cold or infection that affects  the nose, mouth, or throat.  Allergies.  An irritant, such as tobacco smoke.  Adenoids that have become large. The adenoids are soft tissue located in the back of the throat, behind the nose and the roof of the mouth.  Growth or swelling in the upper part of the throat, just behind the nose (nasopharynx).  Damage to the ear caused by change in pressure. This is called barotrauma. What are the signs or symptoms? Symptoms of this condition include:  Ear pain.  Fever.  Problems with hearing.  Being tired.  Fluid leaking from the ear.  Ringing in the ear. How is this treated? This condition can go away on its own within 3-5 days.  But if the condition is caused by bacteria or does not go away on its own, or if it keeps coming back, your doctor may:  Give you antibiotic medicines.  Give you medicines for pain. Follow these instructions at home:  Take over-the-counter and prescription medicines only as told by your doctor.  If you were prescribed an antibiotic medicine, take it as told by your doctor. Do not stop taking the antibiotic even if you start to feel better.  Keep all follow-up visits as told by your doctor. This is important. Contact a doctor if:  You have bleeding from your nose.  There is a lump on your neck.  You are not feeling better in 5 days.  You feel worse instead of better. Get help right away if:  You have pain that is not helped with medicine.  You have swelling, redness, or pain around your ear.  You get a stiff neck.  You cannot move part of your face (paralysis).  You notice that the bone behind your ear hurts when you touch it.  You get a very bad headache. Summary  Otitis media means that the middle ear is red, swollen, and full of fluid.  This condition usually goes away on its own.  If the problem does not go away, treatment may be needed. You may be given medicines to treat the infection or to treat your pain.  If you were prescribed an antibiotic medicine, take it as told by your doctor. Do not stop taking the antibiotic even if you start to feel better.  Keep all follow-up visits as told by your doctor. This is important. This information is not intended to replace advice given to you by your health care provider. Make sure you discuss any questions you have with your health care provider. Document Revised: 04/23/2019 Document Reviewed: 04/23/2019 Elsevier Patient Education  2021 [employer].

## 2020-07-04 NOTE — Progress Notes (Signed)
Patient ID: Jeanne Delgado, female    DOB: [birth year], 35 y.o.   MRN: [id number]   No chief complaint on file.  Subjective:  CC: ear pain   This is a new problem.  Presents today for an acute visit with a complaint of ear pain and itching.  Reports right ear hurts while the left ear has been itching.  Symptoms started around 4 AM this morning with sore throat and the ear pain and itching.  Patient is a smoker, no known exposure to Covid positive person.  She reports that she has experienced some nausea, has taken Zofran which she already has at home.  Negative for fever, chills, pertinent positives include congestion, ear pain, sore throat, cough, reports chest pain from her anxiety, this is not new and nausea.   ears started itching about one week ago. Nurse at job checked ear today and told her left ear was bleeding and lymph nodes were swollen. Sore throat.   Gets tested every week for covid and the last one done came back this morning as undetermined.    Medical History Jeanne Delgado has a past medical history of Abnormal Pap smear, Irregular menses, Obesity, and Vaginal Pap smear, abnormal.   Outpatient Encounter Medications as of 07/04/2020  Medication Sig  . amoxicillin-clavulanate (AUGMENTIN) 875-125 MG tablet Take 1 tablet by mouth 2 (two) times daily.  . famotidine (PEPCID) 40 MG tablet Take 1 tablet (40 mg total) by mouth at bedtime.  . meloxicam (MOBIC) 7.5 MG tablet Take 1 tablet (7.5 mg total) by mouth daily.  Marland Kitchen ofloxacin (FLOXIN OTIC) 0.3 % OTIC solution Place 5 drops into the left ear daily.  . ondansetron (ZOFRAN-ODT) 4 MG disintegrating tablet Take 1 tablet (4 mg total) by mouth every 8 (eight) hours as needed for nausea or vomiting.  . pantoprazole (PROTONIX) 40 MG tablet Take 1 tablet (40 mg total) by mouth daily.  Marland Kitchen PARoxetine (PAXIL) 20 MG tablet Take 0.5 tablets (10 mg total) by mouth daily.  . phentermine (ADIPEX-P) 37.5 MG tablet Take 1 tablet (37.5 mg total) by  mouth daily before breakfast.   No facility-administered encounter medications on file as of 07/04/2020.     Review of Systems  Constitutional: Negative for chills and fever.  HENT: Positive for congestion, ear pain and sore throat.   Respiratory: Positive for cough. Negative for chest tightness and shortness of breath.   Cardiovascular: Positive for chest pain.       From anxiety  Gastrointestinal: Positive for nausea. Negative for abdominal pain, diarrhea and vomiting.  Musculoskeletal: Negative for myalgias.  Neurological: Negative for headaches.     Vitals Pulse 84   Temp 98 F (36.7 C)   Resp 16   SpO2 97%   Objective:   Physical Exam Vitals reviewed.  Constitutional:      Appearance: Normal appearance.  HENT:     Right Ear: Tympanic membrane is erythematous and bulging.     Left Ear: Tympanic membrane normal.     Ears:     Comments: Left ear itching    Mouth/Throat:     Tonsils: 1+ on the right. 2+ on the left.  Cardiovascular:     Rate and Rhythm: Normal rate and regular rhythm.     Heart sounds: Normal heart sounds.  Pulmonary:     Effort: Pulmonary effort is normal.     Breath sounds: Normal breath sounds.  Skin:    General: Skin is warm and dry.  Neurological:  General: No focal deficit present.     Mental Status: She is alert.  Psychiatric:        Behavior: Behavior normal.      Assessment and Plan   1. Non-recurrent acute suppurative otitis media of right ear without spontaneous rupture of tympanic membrane - amoxicillin-clavulanate (AUGMENTIN) 875-125 MG tablet; Take 1 tablet by mouth 2 (two) times daily.  Dispense: 20 tablet; Refill: 0 - Novel Coronavirus, NAA ([facility])  2. Acute otitis externa of left ear, unspecified type - ofloxacin (FLOXIN OTIC) 0.3 % OTIC solution; Place 5 drops into the left ear daily.  Dispense: 5 mL; Refill: 0 - Novel Coronavirus, NAA ([facility])   Will treat right ear for otitis media with antibiotic for 10  days. Left ear otitis externa will treat with drops daily.  She has Zofran at home for nausea.  Declines needing additional Zofran today.  Recommend supportive therapy, adequate hydration.  Agrees with plan of care discussed today. Understands warning signs to seek further care: chest pain, shortness of breath, any significant change in health.  Understands to follow-up if symptoms worsen, do not improve.  Will notify once Covid results are available.  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    Dorena Bodo, NP 07/04/2020

## 2020-07-05 ENCOUNTER — Telehealth: Payer: Self-pay

## 2020-07-05 NOTE — Telephone Encounter (Signed)
Pt states ear drops was $143 and wonders if it can be changed to something else

## 2020-07-05 NOTE — Telephone Encounter (Signed)
[facility] has a coupon to get ear drops for $15.95. pt contacted and states she will come by in the morning and pick up the coupon.

## 2020-07-05 NOTE — Telephone Encounter (Signed)
Patient was seen yesterday for ear pain and bleeding but medication called in cost too much. Can you call in something cheaper to [facility]

## 2020-07-05 NOTE — Telephone Encounter (Signed)
No way of knowing cost until pharmacist runs it through. Just depends on pharmacy and insurance

## 2020-07-05 NOTE — Telephone Encounter (Signed)
How am I to know how much medication cost?  KD

## 2020-07-05 NOTE — Telephone Encounter (Signed)
My concern with ordering something else is the fact that the NP saw blood in the left ear. That could indicate perforation and it is not good to put most drops in the ear if you are not certain about perforation. Maybe she can try [facility] and see if she can find it cheaper. Just a thought.  KD

## 2020-07-06 LAB — NOVEL CORONAVIRUS, NAA: SARS-CoV-2, NAA: NOT DETECTED

## 2020-07-06 LAB — SARS-COV-2, NAA 2 DAY TAT

## 2020-07-06 LAB — SPECIMEN STATUS REPORT

## 2020-07-11 ENCOUNTER — Telehealth: Payer: Self-pay | Admitting: Family Medicine

## 2020-07-11 MED ORDER — PAROXETINE HCL 20 MG PO TABS: 20.0000 mg | ORAL_TABLET | Freq: Every day | ORAL | 1 refills | Status: DC

## 2020-07-11 NOTE — Telephone Encounter (Signed)
Sent MyChart message to patient

## 2020-07-14 NOTE — Telephone Encounter (Signed)
Pt has not read my chart message. Tried to call no answer

## 2020-07-14 NOTE — Telephone Encounter (Signed)
Patient stated she has picked up the script and is taking one a day and so far is doing well with no problems and will follow up as directed

## 2020-07-22 ENCOUNTER — Telehealth: Payer: Self-pay

## 2020-07-22 NOTE — Telephone Encounter (Signed)
Needs appt in office to check urine

## 2020-07-22 NOTE — Telephone Encounter (Signed)
Error

## 2020-07-22 NOTE — Telephone Encounter (Signed)
Patient was seen 1/31 for ear pain,sorethroat and given antibiotic and now she has UTI. She is wandering if you can call in something.[facility]

## 2020-07-27 ENCOUNTER — Other Ambulatory Visit: Payer: Self-pay

## 2020-07-27 ENCOUNTER — Telehealth: Payer: Self-pay | Admitting: Family Medicine

## 2020-08-04 ENCOUNTER — Other Ambulatory Visit: Payer: Self-pay

## 2020-08-04 ENCOUNTER — Telehealth: Payer: Self-pay | Admitting: Family Medicine

## 2020-08-04 ENCOUNTER — Telehealth (INDEPENDENT_AMBULATORY_CARE_PROVIDER_SITE_OTHER): Payer: Self-pay | Admitting: Family Medicine

## 2020-08-04 DIAGNOSIS — F320 Major depressive disorder, single episode, mild: Secondary | ICD-10-CM

## 2020-08-04 DIAGNOSIS — F411 Generalized anxiety disorder: Secondary | ICD-10-CM

## 2020-08-04 DIAGNOSIS — F4321 Adjustment disorder with depressed mood: Secondary | ICD-10-CM

## 2020-08-04 MED ORDER — LORAZEPAM 0.5 MG PO TABS: 0.5000 mg | ORAL_TABLET | Freq: Two times a day (BID) | ORAL | 1 refills | Status: DC | PRN

## 2020-08-04 NOTE — Progress Notes (Signed)
Patient ID: Jeanne Delgado, female    DOB: 07-19-1985, 35 y.o.   MRN: [id number]   Virtual Visit via Telephone Note  I connected with Shelle Iron on 08/04/20 at  9:40 AM EST by telephone and verified that I am speaking with the correct person using two identifiers.  Location: Patient: home Provider: office   I discussed the limitations, risks, security and privacy concerns of performing an evaluation and management service by telephone and the availability of in person appointments. I also discussed with the patient that there may be a patient responsible charge related to this service. The patient expressed understanding and agreed to proceed.   Chief Complaint  Patient presents with  . Anxiety   Subjective:    HPI   Pt needing to discuss Paxil. Pt is on 20 mg Paxil daily. Pt states she feels the med is doing what it should but she is still having some issues.   Pt was on 10mg  paxil and pt went up to 20mg  on last visit in 1/22. Feeling it's working good during the day, but then breaking down at times and not able to control it.  Yesterday- couldn't control the tears and feeling that she might have a panic attack.  Recent passing of a friend 1 wk ago and went to funeral and started thinking about this again and then having the tears and felt not able to control it.  Tried clonazepam more irritable at times. 3x per month. Tried xanax in past, not wanting to be on this long term.  Had 6 sessions of counseling when divorced and went to counseling last year.   And had to file bankruptcy after divorce and overwhelmed and stressed.  Hasn't had this much stress in years.  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   Medical History Pietra has a past medical history of Abnormal Pap smear, Irregular menses, Obesity, and Vaginal Pap smear, abnormal.   Outpatient Encounter Medications as of 08/04/2020  Medication Sig  . famotidine (PEPCID) 40 MG tablet  Take 1 tablet (40 mg total) by mouth at bedtime.  [employer] LORazepam (ATIVAN) 0.5 MG tablet Take 1 tablet (0.5 mg total) by mouth 2 (two) times daily as needed for anxiety.  . meloxicam (MOBIC) 7.5 MG tablet Take 1 tablet (7.5 mg total) by mouth daily.  10/04/2020 ofloxacin (FLOXIN OTIC) 0.3 % OTIC solution Place 5 drops into the left ear daily.  . ondansetron (ZOFRAN-ODT) 4 MG disintegrating tablet Take 1 tablet (4 mg total) by mouth every 8 (eight) hours as needed for nausea or vomiting.  . pantoprazole (PROTONIX) 40 MG tablet Take 1 tablet (40 mg total) by mouth daily.  Marland Kitchen PARoxetine (PAXIL) 20 MG tablet Take 1 tablet (20 mg total) by mouth daily.  . phentermine (ADIPEX-P) 37.5 MG tablet Take 1 tablet (37.5 mg total) by mouth daily before breakfast.  . [DISCONTINUED] amoxicillin-clavulanate (AUGMENTIN) 875-125 MG tablet Take 1 tablet by mouth 2 (two) times daily.   No facility-administered encounter medications on file as of 08/04/2020.     Review of Systems  Constitutional: Negative for chills and fever.  HENT: Negative for congestion, rhinorrhea and sore throat.   Respiratory: Negative for cough, shortness of breath and wheezing.   Cardiovascular: Negative for chest pain and leg swelling.  Gastrointestinal: Negative for abdominal pain, diarrhea, nausea and vomiting.  Genitourinary: Negative for dysuria and frequency.  Musculoskeletal: Negative for arthralgias and back pain.  Skin: Negative for rash.  Neurological:  Negative for dizziness, weakness and headaches.  Psychiatric/Behavioral: Negative for agitation, dysphoric mood, self-injury, sleep disturbance and suicidal ideas. The patient is nervous/anxious.      Vitals There were no vitals taken for this visit.  Objective:   Physical Exam  No PE due to phone visit.  Assessment and Plan   1. Generalized anxiety disorder - LORazepam (ATIVAN) 0.5 MG tablet; Take 1 tablet (0.5 mg total) by mouth 2 (two) times daily as needed for anxiety.   Dispense: 30 tablet; Refill: 1  2. Depression, major, single episode, mild (HCC)  3. Grief reaction   Anxiety/epression-  Felt that some of this is normal grief reaction to recent deaths and that pt should follow up with counseling again to help in combo with the meds. Cont with 20mg  paxil.  May need to inc if still not controlled on next visit.  Recommending counseling and will give small course ativan and f/u 2 months. Call or rto if worsening.  Pt in agreement.  Return in about 2 months (around 10/04/2020) for f/u anxiety.    Follow Up Instructions:    I discussed the assessment and treatment plan with the patient. The patient was provided an opportunity to ask questions and all were answered. The patient agreed with the plan and demonstrated an understanding of the instructions.   The patient was advised to call back or seek an in-person evaluation if the symptoms worsen or if the condition fails to improve as anticipated.  I provided 12 minutes of non-face-to-face time during this encounter.

## 2020-08-04 NOTE — Telephone Encounter (Signed)
[name], sarsfield are scheduled for a virtual visit with your provider today.    Just as we do with appointments in the office, we must obtain your consent to participate.  Your consent will be active for this visit and any virtual visit you may have with one of our providers in the next 365 days.    If you have a MyChart account, I can also send a copy of this consent to you electronically.  All virtual visits are billed to your insurance company just like a traditional visit in the office.  As this is a virtual visit, video technology does not allow for your provider to perform a traditional examination.  This may limit your provider's ability to fully assess your condition.  If your provider identifies any concerns that need to be evaluated in person or the need to arrange testing such as labs, EKG, etc, we will make arrangements to do so.    Although advances in technology are sophisticated, we cannot ensure that it will always work on either your end or our end.  If the connection with a video visit is poor, we may have to switch to a telephone visit.  With either a video or telephone visit, we are not always able to ensure that we have a secure connection.   I need to obtain your verbal consent now.   Are you willing to proceed with your visit today?   Jeanne Delgado has provided verbal consent on 08/04/2020 for a virtual visit (video or telephone).   Marlowe Shores, LPN 07/12/3149  7:61 AM

## 2020-08-25 ENCOUNTER — Encounter: Payer: Self-pay | Admitting: Family Medicine

## 2020-10-05 ENCOUNTER — Ambulatory Visit: Payer: Self-pay | Admitting: Family Medicine

## 2021-04-08 ENCOUNTER — Emergency Department (HOSPITAL_COMMUNITY): Payer: Self-pay

## 2021-04-08 ENCOUNTER — Encounter (HOSPITAL_COMMUNITY): Payer: Self-pay | Admitting: Emergency Medicine

## 2021-04-08 ENCOUNTER — Emergency Department (HOSPITAL_COMMUNITY)
Admission: EM | Admit: 2021-04-08 | Discharge: 2021-04-08 | Disposition: A | Discharge status: Home / Self Care | Payer: Self-pay | Attending: Emergency Medicine | Admitting: Emergency Medicine

## 2021-04-08 ENCOUNTER — Other Ambulatory Visit: Payer: Self-pay

## 2021-04-08 DIAGNOSIS — R350 Frequency of micturition: Secondary | ICD-10-CM | POA: Insufficient documentation

## 2021-04-08 DIAGNOSIS — R0789 Other chest pain: Secondary | ICD-10-CM | POA: Insufficient documentation

## 2021-04-08 DIAGNOSIS — R300 Dysuria: Secondary | ICD-10-CM | POA: Insufficient documentation

## 2021-04-08 DIAGNOSIS — R059 Cough, unspecified: Secondary | ICD-10-CM | POA: Insufficient documentation

## 2021-04-08 DIAGNOSIS — R110 Nausea: Secondary | ICD-10-CM | POA: Insufficient documentation

## 2021-04-08 DIAGNOSIS — R0981 Nasal congestion: Secondary | ICD-10-CM | POA: Insufficient documentation

## 2021-04-08 DIAGNOSIS — F17210 Nicotine dependence, cigarettes, uncomplicated: Secondary | ICD-10-CM | POA: Insufficient documentation

## 2021-04-08 DIAGNOSIS — Z20822 Contact with and (suspected) exposure to covid-19: Secondary | ICD-10-CM | POA: Insufficient documentation

## 2021-04-08 DIAGNOSIS — R0602 Shortness of breath: Secondary | ICD-10-CM | POA: Insufficient documentation

## 2021-04-08 HISTORY — DX: Anxiety disorder, unspecified: F41.9

## 2021-04-08 HISTORY — DX: Depression, unspecified: F32.A

## 2021-04-08 LAB — CBC
HCT: 43.9 % (ref 36.0–46.0)
Hemoglobin: 15.2 g/dL — ABNORMAL HIGH (ref 12.0–15.0)
MCH: 32.1 pg (ref 26.0–34.0)
MCHC: 34.6 g/dL (ref 30.0–36.0)
MCV: 92.8 fL (ref 80.0–100.0)
Platelets: 224 10*3/uL (ref 150–400)
RBC: 4.73 MIL/uL (ref 3.87–5.11)
RDW: 12.4 % (ref 11.5–15.5)
WBC: 10.7 10*3/uL — ABNORMAL HIGH (ref 4.0–10.5)
nRBC: 0 % (ref 0.0–0.2)

## 2021-04-08 LAB — BASIC METABOLIC PANEL
Anion gap: 9 (ref 5–15)
BUN: 8 mg/dL (ref 6–20)
CO2: 25 mmol/L (ref 22–32)
Calcium: 9.3 mg/dL (ref 8.9–10.3)
Chloride: 105 mmol/L (ref 98–111)
Creatinine, Ser: 0.78 mg/dL (ref 0.44–1.00)
GFR, Estimated: >60 mL/min (ref >60)
Glucose, Bld: 100 mg/dL — ABNORMAL HIGH (ref 70–99)
Potassium: 3.9 mmol/L (ref 3.5–5.1)
Sodium: 139 mmol/L (ref 135–145)

## 2021-04-08 LAB — TROPONIN I (HIGH SENSITIVITY)
Troponin I (High Sensitivity): <2 ng/L (ref <18)
Troponin I (High Sensitivity): <2 ng/L (ref <18)

## 2021-04-08 LAB — RESP PANEL BY RT-PCR (FLU A&B, COVID) ARPGX2
Influenza A by PCR: NEGATIVE
Influenza B by PCR: NEGATIVE
SARS Coronavirus 2 by RT PCR: NEGATIVE

## 2021-04-08 MED ORDER — HYDROXYZINE HCL 25 MG PO TABS: 25.0000 mg | ORAL_TABLET | Freq: Four times a day (QID) | ORAL | 0 refills | Status: DC | PRN

## 2021-04-08 MED ORDER — MORPHINE SULFATE (PF) 4 MG/ML IV SOLN
4.0000 mg | Freq: Once | INTRAVENOUS | Status: DC
  Filled 2021-04-08: qty 1

## 2021-04-08 MED ORDER — LORAZEPAM 2 MG/ML IJ SOLN
1.0000 mg | Freq: Once | INTRAMUSCULAR | Status: AC
  Administered 2021-04-08: 1 mg via INTRAVENOUS
  Filled 2021-04-08 (×2): qty 1

## 2021-04-08 NOTE — ED Provider Notes (Signed)
[facility] EMERGENCY DEPARTMENT Provider Note   CSN: [id number] Arrival date & time: 04/08/21  1106     History Chief Complaint  Patient presents with   Chest Pain    Jeanne Delgado is a 35 y.o. female.  The history is provided by the patient and medical records. No language interpreter was used.  Chest Pain  35 year old female significant history of anxiety, depression, obesity, GERD, IBS who presents complaining of chest pain.  Patient endorsed pain to her anterior chest as well as pain to her left arm that started since yesterday.  She described pain as a heaviness pressure sensation, with associated nausea, shortness of breath, sinus congestion, nonproductive cough.  Pain has been waxing waning but she feels very tired and slept longer than usual.  She also endorsed some increased urinary frequency without burning urination.  Her pain has since subsided and currently rates as 4 out of 10.  No fever chills sore throat.  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  Has history of heartburn but states this felt different.  Denies any significant cardiac history.  Endorse occasional marijuana use but denies alcohol abuse.  Initially felt that this could be a panic attack as she is having some increasing stress in her life.  Past Medical History:  Diagnosis Date   Abnormal Pap smear    Anxiety    Depression    Irregular menses    Obesity    Vaginal Pap smear, abnormal     Patient Active Problem List   Diagnosis Date Noted   Generalized anxiety disorder 08/04/2020   Depression, major, single episode, mild (HCC) 08/04/2020   Non-recurrent acute suppurative otitis media of right ear without spontaneous rupture of tympanic membrane 07/04/2020   Acute otitis externa of left ear 07/04/2020   Encounter for gynecological examination with Papanicolaou smear of cervix 05/30/2016   Morbid obesity (HCC) 07/13/2014   Gastroesophageal reflux disease with  esophagitis 07/13/2014   Irritable bowel syndrome 05/05/2014    History reviewed. No pertinent surgical history.   OB History     Gravida  1   Para      Term      Preterm      AB  1   Living         SAB  1   IAB      Ectopic      Multiple      Live Births              Family History  Problem Relation Age of Onset   Cancer Other    Diabetes Maternal Grandmother    Liver disease Father     Social History   Tobacco Use   Smoking status: Every Day    Packs/day: 0.25    Types: Cigarettes   Smokeless tobacco: Never  Vaping Use   Vaping Use: Never used  Substance Use Topics   Alcohol use: Yes    Comment: rarely   Drug use: Yes    Frequency: 1.0 times per week    Types: Marijuana    Home Medications Prior to Admission medications   Medication Sig Start Date End Date Taking? Authorizing Provider  famotidine (PEPCID) 40 MG tablet Take 1 tablet (40 mg total) by mouth at bedtime. 04/26/20   Ladona Ridgel, Malena M, DO  LORazepam (ATIVAN) 0.5 MG tablet Take 1 tablet (0.5 mg total) by mouth 2 (two) times daily as needed for anxiety. 08/04/20  Ladona Ridgel, Malena M, DO  meloxicam (MOBIC) 7.5 MG tablet Take 1 tablet (7.5 mg total) by mouth daily. 06/29/20   Ladona Ridgel, Malena M, DO  ofloxacin (FLOXIN OTIC) 0.3 % OTIC solution Place 5 drops into the left ear daily. 07/04/20   Novella Olive, NP  ondansetron (ZOFRAN-ODT) 4 MG disintegrating tablet Take 1 tablet (4 mg total) by mouth every 8 (eight) hours as needed for nausea or vomiting. 03/29/20   Ladona Ridgel, Malena M, DO  pantoprazole (PROTONIX) 40 MG tablet Take 1 tablet (40 mg total) by mouth daily. 06/29/20   Ladona Ridgel, Malena M, DO  PARoxetine (PAXIL) 20 MG tablet Take 1 tablet (20 mg total) by mouth daily. 07/11/20   Annalee Genta, DO  phentermine (ADIPEX-P) 37.5 MG tablet Take 1 tablet (37.5 mg total) by mouth daily before breakfast. 06/29/20   Laroy Apple M, DO    Allergies    Dimetapp decongestant  [pseudoephedrine]  Review of Systems   Review of Systems  Cardiovascular:  Positive for chest pain.  All other systems reviewed and are negative.  Physical Exam Updated Vital Signs BP 116/70 (BP Location: Right Arm)   Pulse 73   Temp 98.7 F (37.1 C) (Oral)   Resp 16   Ht 5\' 7"  (1.702 m)   Wt 95.7 kg   LMP 03/21/2021 (Approximate)   SpO2 100%   BMI 33.05 kg/m   Physical Exam Vitals and nursing note reviewed.  Constitutional:      General: She is not in acute distress.    Appearance: She is well-developed.  HENT:     Head: Atraumatic.  Eyes:     Conjunctiva/sclera: Conjunctivae normal.  Pulmonary:     Effort: Pulmonary effort is normal.  Musculoskeletal:     Cervical back: Neck supple.  Skin:    Findings: No rash.  Neurological:     Mental Status: She is alert.  Psychiatric:        Mood and Affect: Mood normal.    ED Results / Procedures / Treatments   Labs (all labs ordered are listed, but only abnormal results are displayed) Labs Reviewed  BASIC METABOLIC PANEL - Abnormal; Notable for the following components:      Result Value   Glucose, Bld 100 (*)    All other components within normal limits  CBC - Abnormal; Notable for the following components:   WBC 10.7 (*)    Hemoglobin 15.2 (*)    All other components within normal limits  RESP PANEL BY RT-PCR (FLU A&B, COVID) [id number]  URINALYSIS, ROUTINE W REFLEX MICROSCOPIC  TROPONIN I (HIGH SENSITIVITY)  TROPONIN I (HIGH SENSITIVITY)    EKG EKG Interpretation  Date/Time:  Saturday April 08 2021 11:24:48 EDT Ventricular Rate:  58 PR Interval:  148 QRS Duration: 78 QT Interval:  390 QTC Calculation: 382 R Axis:   47 Text Interpretation: Sinus bradycardia with sinus arrhythmia Otherwise normal ECG Confirmed by 1968 [phone]) on 04/08/2021 12:45:07 PM  Radiology DG Chest 2 View  Result Date: 04/08/2021 CLINICAL DATA:  35 year old female with chest pain EXAM: CHEST - 2 VIEW COMPARISON:  None.  FINDINGS: Cardiomediastinal silhouette within normal limits in size and contour. No evidence of central vascular congestion. No interlobular septal thickening. No pneumothorax or pleural effusion. Coarsened interstitial markings, with no confluent airspace disease. No acute displaced fracture. Degenerative changes of the spine. IMPRESSION: No active cardiopulmonary disease. Electronically Signed   By: 20 D.O.   On: 04/08/2021 12:49    Procedures  Procedures   Medications Ordered in ED Medications  morphine 4 MG/ML injection 4 mg (4 mg Intravenous Patient Refused/Not Given 04/08/21 1312)  LORazepam (ATIVAN) injection 1 mg (1 mg Intravenous Given 04/08/21 1333)    ED Course  I have reviewed the triage vital signs and the nursing notes.  Pertinent labs & imaging results that were available during my care of the patient were reviewed by me and considered in my medical decision making (see chart for details).    MDM Rules/Calculators/A&P                           BP 103/66   Pulse 61   Temp 98.7 F (37.1 C) (Oral)   Resp 13   Ht 5\' 7"  (1.702 m)   Wt 95.7 kg   LMP 03/21/2021 (Approximate)   SpO2 99%   BMI 33.05 kg/m   Final Clinical Impression(s) / ED Diagnoses Final diagnoses:  Atypical chest pain    Rx / DC Orders ED Discharge Orders          Ordered    hydrOXYzine (ATARAX/VISTARIL) 25 MG tablet  Every 6 hours PRN        04/08/21 1530           Patient with a significant cardiac history who presents complaining of chest pain and left arm pain.  Her pain is atypical of ACS, her hear score is <3.  She is PERC negative, doubt PE.  Suspect this is likely to be anxiety driven.  Work-up essentially unremarkable.  Encourage patient to follow-up closely with primary care provider for further assessment.  Will discharge home with Vistaril to use as needed.  Return precaution given.  Patient voiced understanding and agrees with plan.  She is stable for discharge.  Vital  signs stable   13/05/22, PA-C 04/08/21 1531    13/05/22, MD 04/11/21 [phone]

## 2021-04-08 NOTE — Discharge Instructions (Signed)
You have been evaluated for your symptoms.  Fortunately no acute emergent medical condition identified during this visit.  Your discomfort may be related to bouts of anxiety or panic attack.  Take Vistaril as needed for anxiety.  Please call and follow-up closely with your primary care doctor for further care, return if you have any concern.

## 2021-04-08 NOTE — ED Triage Notes (Signed)
Pt to the ED with Left side chest pain described as squeezing. The pain began yesterday and the pt assumed she was having a panic attack, but it began again upon waking this morning.

## 2021-12-28 ENCOUNTER — Encounter (INDEPENDENT_AMBULATORY_CARE_PROVIDER_SITE_OTHER): Payer: Self-pay | Admitting: *Deleted

## 2022-03-22 ENCOUNTER — Ambulatory Visit (INDEPENDENT_AMBULATORY_CARE_PROVIDER_SITE_OTHER): Payer: Self-pay | Admitting: Gastroenterology

## 2022-03-22 ENCOUNTER — Encounter (INDEPENDENT_AMBULATORY_CARE_PROVIDER_SITE_OTHER): Payer: Self-pay | Admitting: Gastroenterology

## 2022-03-22 ENCOUNTER — Other Ambulatory Visit (INDEPENDENT_AMBULATORY_CARE_PROVIDER_SITE_OTHER): Payer: Self-pay

## 2022-03-22 ENCOUNTER — Encounter (INDEPENDENT_AMBULATORY_CARE_PROVIDER_SITE_OTHER): Payer: Self-pay

## 2022-03-22 VITALS — BP 110/79 | HR 114 | Temp 98.0°F | Ht 67.5 in | Wt 220.3 lb

## 2022-03-22 DIAGNOSIS — K219 Gastro-esophageal reflux disease without esophagitis: Secondary | ICD-10-CM

## 2022-03-22 DIAGNOSIS — R1310 Dysphagia, unspecified: Secondary | ICD-10-CM

## 2022-03-22 DIAGNOSIS — Z01812 Encounter for preprocedural laboratory examination: Secondary | ICD-10-CM

## 2022-03-22 DIAGNOSIS — R197 Diarrhea, unspecified: Secondary | ICD-10-CM

## 2022-03-22 DIAGNOSIS — R109 Unspecified abdominal pain: Secondary | ICD-10-CM | POA: Insufficient documentation

## 2022-03-22 DIAGNOSIS — R112 Nausea with vomiting, unspecified: Secondary | ICD-10-CM

## 2022-03-22 MED ORDER — OMEPRAZOLE 40 MG PO CPDR: 40.0000 mg | DELAYED_RELEASE_CAPSULE | Freq: Two times a day (BID) | ORAL | 1 refills | Status: AC

## 2022-03-22 NOTE — Progress Notes (Addendum)
Referring Provider: Monico Blitz, MD Primary Care Physician:  Monico Blitz, MD Primary GI Physician: new  Chief Complaint  Patient presents with   Gastroesophageal Reflux    Currently taking otc Nexium she states its not working anymore, was taking Protonix prior and it didn't work well reflux has gotten worse. Complaining of nausea vomiting with excessive diarrhea she says she stays dehydrated a lot. She states she is throwing up the Pristiq   HPI:   Jeanne Delgado is a 36 y.o. female with past medical history of anxiety, depression, GERD  Patient presenting today as a new patient for GERD.  Patient states she has hx of GERD for the past few years, previously on pantoprazole which worked initially and then began to wear off after a while. Doing nexium 20mg  daily for about 8 months once daily. Had some initial improvement with nexium but now having more acid regurgitation, 3-4 days out of the week. Sometimes will have nausea and vomiting. She also has some abdominal discomfort. Avoids spicy, greasy and tomato based foods. Has decreased sodas as well. She denies changes in appetite or weight loss. Has occasional early satiety at times. Sometimes notes that foods dont go down well. This occurs 2-3x/week she can eventually get substance to pass with liquids.   Also reports Having diarrhea for the last 1-1.5. having 1-2 episodes of diarrhea per day with loose to watery stools.  Notes normal BMs at baseline. Has occasional scant toilet tissue hematochezia if she has mutliple BMs and rectal area feels raw.  Recent course of fluconazole that she finished on Saturday. Denies any other abx  NSAID use: aleve on occasion  Social hx: occasional etoh and tobacco  Fam hx: father has non alcoholic cirrhosis of the liver  Last Colonoscopy:never  Last Endoscopy:never  Recommendations:    Past Medical History:  Diagnosis Date   Abnormal Pap smear    Anxiety    Depression    Irregular menses     Obesity    Vaginal Pap smear, abnormal     No past surgical history on file.  Current Outpatient Medications  Medication Sig Dispense Refill   desvenlafaxine (PRISTIQ) 50 MG 24 hr tablet Take 50 mg by mouth daily.     esomeprazole (NEXIUM) 20 MG capsule Take 20 mg by mouth daily at 12 noon.     Multiple Vitamin (MULTIVITAMIN) tablet Take 1 tablet by mouth daily.     ondansetron (ZOFRAN-ODT) 4 MG disintegrating tablet Take 1 tablet (4 mg total) by mouth every 8 (eight) hours as needed for nausea or vomiting. 15 tablet 0   famotidine (PEPCID) 40 MG tablet Take 1 tablet (40 mg total) by mouth at bedtime. (Patient not taking: Reported on 04/08/2021) 30 tablet 2   hydrOXYzine (ATARAX/VISTARIL) 25 MG tablet Take 1 tablet (25 mg total) by mouth every 6 (six) hours as needed. (Patient not taking: Reported on 03/22/2022) 12 tablet 0   LORazepam (ATIVAN) 0.5 MG tablet Take 1 tablet (0.5 mg total) by mouth 2 (two) times daily as needed for anxiety. (Patient not taking: Reported on 04/08/2021) 30 tablet 1   meloxicam (MOBIC) 7.5 MG tablet Take 1 tablet (7.5 mg total) by mouth daily. (Patient not taking: Reported on 03/22/2022) 30 tablet 1   Norethindrone Acetate-Ethinyl Estrad-FE (LOESTRIN 24 FE) 1-20 MG-MCG(24) tablet Take 1 tablet by mouth daily. (Patient not taking: Reported on 04/08/2021)     ofloxacin (FLOXIN OTIC) 0.3 % OTIC solution Place 5 drops into the left  ear daily. (Patient not taking: Reported on 04/08/2021) 5 mL 0   pantoprazole (PROTONIX) 40 MG tablet Take 1 tablet (40 mg total) by mouth daily. (Patient not taking: Reported on 03/22/2022) 30 tablet 3   PARoxetine (PAXIL) 20 MG tablet Take 1 tablet (20 mg total) by mouth daily. (Patient not taking: Reported on 04/08/2021) 30 tablet 1   phentermine (ADIPEX-P) 37.5 MG tablet Take 1 tablet (37.5 mg total) by mouth daily before breakfast. (Patient not taking: Reported on 04/08/2021) 30 tablet 0   No current facility-administered medications for  this visit.    Allergies as of 03/22/2022 - Review Complete 03/22/2022  Allergen Reaction Noted   Dimetapp decongestant [pseudoephedrine]  11/05/2012    Family History  Problem Relation Age of Onset   Cancer Other    Diabetes Maternal Grandmother    Liver disease Father     Social History   Socioeconomic History   Marital status: Legally Separated    Spouse name: Not on file   Number of children: Not on file   Years of education: Not on file   Highest education level: Not on file  Occupational History   Not on file  Tobacco Use   Smoking status: Every Day    Packs/day: 0.25    Types: Cigarettes   Smokeless tobacco: Never  Vaping Use   Vaping Use: Never used  Substance and Sexual Activity   Alcohol use: Yes    Comment: rarely   Drug use: Yes    Frequency: 1.0 times per week    Types: Marijuana   Sexual activity: Yes    Partners: Male    Birth control/protection: None  Other Topics Concern   Not on file  Social History Narrative   Not on file   Social Determinants of Health   Financial Resource Strain: Not on file  Food Insecurity: Not on file  Transportation Needs: Not on file  Physical Activity: Not on file  Stress: Not on file  Social Connections: Not on file    Review of systems General: negative for malaise, night sweats, fever, chills, weight loss Neck: Negative for lumps, goiter, pain and significant neck swelling Resp: Negative for cough, wheezing, dyspnea at rest CV: Negative for chest pain, leg swelling, palpitations, orthopnea GI: denies melena, hematochezia, constipation, odynophagia,  or unintentional weight loss. +dysphagia +gerd +nausea/vomiting +diarrhea +early satiety  MSK: Negative for joint pain or swelling, back pain, and muscle pain. Derm: Negative for itching or rash Psych: Denies depression, anxiety, memory loss, confusion. No homicidal or suicidal ideation.  Heme: Negative for prolonged bleeding, bruising easily, and swollen  nodes. Endocrine: Negative for cold or heat intolerance, polyuria, polydipsia and goiter. Neuro: negative for tremor, gait imbalance, syncope and seizures. The remainder of the review of systems is noncontributory.  Physical Exam: BP 110/79 (BP Location: Left Arm, Patient Position: Sitting, Cuff Size: Normal)   Pulse (!) 114   Temp 98 F (36.7 C) (Oral)   Ht 5' 7.5" (1.715 m)   Wt 220 lb 4.8 oz (99.9 kg)   BMI 33.99 kg/m  General:   Alert and oriented. No distress noted. Pleasant and cooperative.  Head:  Normocephalic and atraumatic. Eyes:  Conjuctiva clear without scleral icterus. Mouth:  Oral mucosa pink and moist. Good dentition. No lesions. Heart: Normal rate and rhythm, s1 and s2 heart sounds present.  Lungs: Clear lung sounds in all lobes. Respirations equal and unlabored. Abdomen:  +BS, soft, non-tender and non-distended. No rebound or guarding. No HSM  or masses noted. Derm: No palmar erythema or jaundice Msk:  Symmetrical without gross deformities. Normal posture. Extremities:  Without edema. Neurologic:  Alert and  oriented x4 Psych:  Alert and cooperative. Normal mood and affect.  Invalid input(s): "6 MONTHS"   ASSESSMENT: Jeanne Delgado is a 36 y.o. female presenting today as a new patient for GERD/dysphagia, and diarrhea.  GERD for the past few years, previously on protonix with some results for a while then switched to nexium 20mg  otc which worked for a while but recently not controlling her symptoms. Having frequent acid regurgitation and nausea and vomiting now. Also with some early satiety. Denies frequent NSAID or ETOH use. She is also having dysphagia a few times per week. Will stop nexium, start omeprazole BID and recommend proceeding with EGD for further evaluation of symptoms with possible esophageal dilation. Discussed reflux precautions as well. Indications, risks and benefits of procedure discussed in detail with patient. Patient verbalized understanding  and is in agreement to proceed with EGD +/- dilation at this time.   In regards to diarrhea, this began 1-1.5 weeks ago, did have recent course of fluconazole which could be contributing to her diarrhea. No other new medications or antibiotics. No melena. Very rare scant toilet tissue hematochezia if having multiple BMs and rectal area feels raw but no overt bleeding. Will check stool studies to rule out underlying infection.    PLAN:  Omeprazole 40mg  BID   2. Schedule EGD ASA II ENDO 1  3. C diff, o&p, GI path   4. Reflux precautions   All questions were answered, patient verbalized understanding and is in agreement with plan as outlined above.   Follow Up: 3 months   Taryll Reichenberger L. Alver Sorrow, MSN, APRN, AGNP-C Adult-Gerontology Nurse Practitioner [facility]  I have reviewed the note and agree with the APP's assessment as described in this progress note  Maylon Peppers, MD Gastroenterology and Hepatology [facility]

## 2022-03-22 NOTE — Patient Instructions (Signed)
Nice to meet you! Please stop nexium I have sent omeprazole 40mg  to your pharmacy Please take this 30 minutes prior to breakfast and again 30 minutes prior to dinner Avoid greasy, spicy, fried, citrus foods, and be mindful that caffeine, carbonated drinks, chocolate and alcohol can increase reflux symptoms. Stay upright 2-3 hours after eating, prior to lying down and avoid eating late in the evenings. We will get you scheduled for upper endoscopy for further evaluation and check stool studies to make sure diarrhea is not from an infection.  Follow up 3 months

## 2022-03-29 ENCOUNTER — Other Ambulatory Visit (INDEPENDENT_AMBULATORY_CARE_PROVIDER_SITE_OTHER): Payer: Self-pay

## 2022-04-02 ENCOUNTER — Other Ambulatory Visit (HOSPITAL_COMMUNITY)
Admission: RE | Admit: 2022-04-02 | Discharge: 2022-04-02 | Disposition: A | Discharge status: Home / Self Care | Payer: Managed Care, Other (non HMO) | Source: Ambulatory Visit | Attending: Gastroenterology | Admitting: Gastroenterology

## 2022-04-02 DIAGNOSIS — Z01812 Encounter for preprocedural laboratory examination: Secondary | ICD-10-CM | POA: Diagnosis not present

## 2022-04-02 LAB — PREGNANCY, URINE: Preg Test, Ur: NEGATIVE

## 2022-04-03 ENCOUNTER — Encounter (HOSPITAL_COMMUNITY): Payer: Self-pay | Admitting: Anesthesiology

## 2022-04-04 ENCOUNTER — Encounter (HOSPITAL_COMMUNITY): Admission: RE | Payer: Self-pay | Source: Home / Self Care

## 2022-04-04 ENCOUNTER — Ambulatory Visit (HOSPITAL_COMMUNITY)
Admission: RE | Admit: 2022-04-04 | Payer: Managed Care, Other (non HMO) | Source: Home / Self Care | Admitting: Gastroenterology

## 2022-04-04 DIAGNOSIS — R1310 Dysphagia, unspecified: Secondary | ICD-10-CM

## 2022-04-04 DIAGNOSIS — R112 Nausea with vomiting, unspecified: Secondary | ICD-10-CM

## 2022-04-04 DIAGNOSIS — K219 Gastro-esophageal reflux disease without esophagitis: Secondary | ICD-10-CM

## 2022-04-04 SURGERY — ESOPHAGOGASTRODUODENOSCOPY (EGD) WITH PROPOFOL
Anesthesia: Monitor Anesthesia Care

## 2022-06-19 ENCOUNTER — Ambulatory Visit (INDEPENDENT_AMBULATORY_CARE_PROVIDER_SITE_OTHER): Payer: Self-pay | Admitting: Gastroenterology

## 2022-07-26 ENCOUNTER — Other Ambulatory Visit: Payer: Self-pay | Admitting: Internal Medicine

## 2022-07-26 DIAGNOSIS — M79605 Pain in left leg: Secondary | ICD-10-CM

## 2022-07-26 DIAGNOSIS — M7989 Other specified soft tissue disorders: Secondary | ICD-10-CM

## 2022-08-01 ENCOUNTER — Ambulatory Visit
Admission: RE | Admit: 2022-08-01 | Discharge: 2022-08-01 | Disposition: A | Discharge status: Home / Self Care | Payer: Managed Care, Other (non HMO) | Source: Ambulatory Visit | Attending: Internal Medicine | Admitting: Internal Medicine

## 2022-08-01 DIAGNOSIS — M7989 Other specified soft tissue disorders: Secondary | ICD-10-CM | POA: Insufficient documentation

## 2022-08-01 DIAGNOSIS — M79605 Pain in left leg: Secondary | ICD-10-CM | POA: Diagnosis present
